# Patient Record
Sex: Female | Born: 1970 | Race: White | Hispanic: No | Marital: Married | Smoking: Never smoker
Health system: Southern US, Community
[De-identification: ages and names within clinical notes are randomized; demographics above are authoritative.]

## PROBLEM LIST (undated history)

## (undated) DIAGNOSIS — F4310 Post-traumatic stress disorder, unspecified: Secondary | ICD-10-CM

## (undated) DIAGNOSIS — F32A Depression, unspecified: Secondary | ICD-10-CM

## (undated) DIAGNOSIS — R519 Headache, unspecified: Secondary | ICD-10-CM

## (undated) DIAGNOSIS — N926 Irregular menstruation, unspecified: Secondary | ICD-10-CM

## (undated) DIAGNOSIS — F329 Major depressive disorder, single episode, unspecified: Secondary | ICD-10-CM

## (undated) DIAGNOSIS — R51 Headache: Secondary | ICD-10-CM

## (undated) DIAGNOSIS — T7840XA Allergy, unspecified, initial encounter: Secondary | ICD-10-CM

## (undated) DIAGNOSIS — N920 Excessive and frequent menstruation with regular cycle: Secondary | ICD-10-CM

## (undated) DIAGNOSIS — R102 Pelvic and perineal pain: Secondary | ICD-10-CM

## (undated) HISTORY — PX: WISDOM TOOTH EXTRACTION: SHX21

## (undated) HISTORY — DX: Pelvic and perineal pain: R10.2

## (undated) HISTORY — DX: Irregular menstruation, unspecified: N92.6

## (undated) HISTORY — PX: DILATION AND CURETTAGE OF UTERUS: SHX78

## (undated) HISTORY — DX: Excessive and frequent menstruation with regular cycle: N92.0

## (undated) HISTORY — PX: KNEE SURGERY: SHX244

## (undated) HISTORY — DX: Allergy, unspecified, initial encounter: T78.40XA

## (undated) HISTORY — DX: Post-traumatic stress disorder, unspecified: F43.10

## (undated) HISTORY — DX: Major depressive disorder, single episode, unspecified: F32.9

## (undated) HISTORY — DX: Depression, unspecified: F32.A

---

## 1996-09-05 HISTORY — PX: TUBAL LIGATION: SHX77

## 2007-01-11 ENCOUNTER — Ambulatory Visit: Payer: Self-pay | Admitting: Family Medicine

## 2008-04-28 ENCOUNTER — Ambulatory Visit: Payer: Self-pay | Admitting: Family Medicine

## 2008-06-25 ENCOUNTER — Ambulatory Visit: Payer: Self-pay | Admitting: Family Medicine

## 2009-01-21 ENCOUNTER — Other Ambulatory Visit: Admission: RE | Admit: 2009-01-21 | Discharge: 2009-01-21 | Payer: Self-pay | Admitting: Family Medicine

## 2009-01-21 ENCOUNTER — Ambulatory Visit: Payer: Self-pay | Admitting: Family Medicine

## 2010-01-05 ENCOUNTER — Ambulatory Visit: Payer: Self-pay | Admitting: Physician Assistant

## 2010-09-13 ENCOUNTER — Ambulatory Visit
Admission: RE | Admit: 2010-09-13 | Discharge: 2010-09-13 | Payer: Self-pay | Source: Home / Self Care | Attending: Family Medicine | Admitting: Family Medicine

## 2011-02-21 ENCOUNTER — Encounter: Payer: Self-pay | Admitting: Family Medicine

## 2011-03-14 ENCOUNTER — Ambulatory Visit: Payer: Self-pay | Admitting: Family Medicine

## 2011-04-11 ENCOUNTER — Other Ambulatory Visit: Payer: Self-pay

## 2011-04-11 MED ORDER — BUPROPION HCL ER (SR) 150 MG PO TB12: 150.0000 mg | ORAL_TABLET | Freq: Two times a day (BID) | ORAL | Status: DC

## 2011-04-12 ENCOUNTER — Other Ambulatory Visit: Payer: Self-pay

## 2011-04-12 MED ORDER — BUPROPION HCL ER (SR) 150 MG PO TB12: 150.0000 mg | ORAL_TABLET | Freq: Two times a day (BID) | ORAL | Status: DC

## 2011-05-06 ENCOUNTER — Encounter: Payer: Self-pay | Admitting: Family Medicine

## 2011-05-06 ENCOUNTER — Other Ambulatory Visit: Payer: Self-pay | Admitting: Family Medicine

## 2014-07-16 ENCOUNTER — Telehealth: Payer: Self-pay

## 2014-07-16 DIAGNOSIS — R102 Pelvic and perineal pain: Secondary | ICD-10-CM

## 2014-07-16 DIAGNOSIS — N939 Abnormal uterine and vaginal bleeding, unspecified: Secondary | ICD-10-CM

## 2014-07-16 NOTE — Telephone Encounter (Signed)
[facility]-- patient visits from 2011-2012. Called office to see what referral is for-- spoke to receptionist who stated she spoke to the provider who referred her and he stated that patient had come in to their office needing an appointment for heavy bleeding and pain with periods. They were unable to see patient due to patient's financial limitations. She does not have insurance. Attempted to contact patient regarding symptoms and possible appointment. Home phone listed was answered by a man who stated I had the wrong number. Called mobile phone-- no voicemail set up. [facility] state they have patient's number as [phone]. Called patient who states she had been having very heavy periods, lasting 7-10 days, however, recently she began bleeding about every two weeks and describes having "the worst pain ever" during her last episode of bleeding. Patient states she has been having  bloating, pressure and pelvic/hip pain-- relates it to the pain she experienced in her last month of pregnancy-- and states "I am having pain even when I am not bleeding." Informed patient we would be getting an appointment scheduled for her, however, I will discuss her case with provider incase an U/S is needed prior to appointment. Patient verbalized understanding and gratitude.   Discussed patient with Dr. Erin FullingHarraway-Smith who requests U/S be done prior to appointment. U/S scheduled for 07/22/14 at 1300. Clinic appointment 07/23/14 at 1545. Attempted to contact patient regarding appointments. Phone rang and rang and then heard busy tone. Unable to leave message.

## 2014-07-17 NOTE — Telephone Encounter (Signed)
Called patient at home and mobile number, no answer and unable to leave message on either

## 2014-07-18 NOTE — Telephone Encounter (Signed)
Called patient and she states she is already aware of those appts. Patient had no questions

## 2014-07-22 ENCOUNTER — Other Ambulatory Visit (HOSPITAL_COMMUNITY): Payer: Self-pay

## 2014-07-23 ENCOUNTER — Encounter: Payer: Self-pay | Admitting: Obstetrics & Gynecology

## 2014-07-23 ENCOUNTER — Ambulatory Visit (HOSPITAL_COMMUNITY)
Admission: RE | Admit: 2014-07-23 | Discharge: 2014-07-23 | Disposition: A | Discharge status: Home / Self Care | Payer: Self-pay | Source: Ambulatory Visit | Attending: Obstetrics & Gynecology | Admitting: Obstetrics & Gynecology

## 2014-07-23 ENCOUNTER — Ambulatory Visit (HOSPITAL_COMMUNITY): Payer: Self-pay

## 2014-07-23 ENCOUNTER — Ambulatory Visit (INDEPENDENT_AMBULATORY_CARE_PROVIDER_SITE_OTHER): Payer: Self-pay | Admitting: Obstetrics & Gynecology

## 2014-07-23 VITALS — BP 129/77 | HR 66 | Temp 98.1°F | Resp 20 | Ht 65.5 in | Wt 200.5 lb

## 2014-07-23 DIAGNOSIS — R938 Abnormal findings on diagnostic imaging of other specified body structures: Secondary | ICD-10-CM

## 2014-07-23 DIAGNOSIS — R9389 Abnormal findings on diagnostic imaging of other specified body structures: Secondary | ICD-10-CM | POA: Insufficient documentation

## 2014-07-23 DIAGNOSIS — R102 Pelvic and perineal pain: Secondary | ICD-10-CM

## 2014-07-23 DIAGNOSIS — N939 Abnormal uterine and vaginal bleeding, unspecified: Secondary | ICD-10-CM

## 2014-07-23 DIAGNOSIS — N859 Noninflammatory disorder of uterus, unspecified: Secondary | ICD-10-CM | POA: Insufficient documentation

## 2014-07-23 MED ORDER — NAPROXEN 500 MG PO TABS: 500.0000 mg | ORAL_TABLET | Freq: Two times a day (BID) | ORAL | Status: DC

## 2014-07-23 MED ORDER — MEGESTROL ACETATE 20 MG PO TABS: 40.0000 mg | ORAL_TABLET | Freq: Two times a day (BID) | ORAL | Status: DC

## 2014-07-23 NOTE — Patient Instructions (Signed)
Endometrial Ablation Endometrial ablation removes the lining of the uterus (endometrium). It is usually a same-day, outpatient treatment. Ablation helps avoid major surgery, such as surgery to remove the cervix and uterus (hysterectomy). After endometrial ablation, you will have little or no menstrual bleeding and may not be able to have children. However, if you are premenopausal, you will need to use a reliable method of birth control following the procedure because of the small chance that pregnancy can occur. There are different reasons to have this procedure, which include:  Heavy periods.  Bleeding that is causing anemia.  Irregular bleeding.  Bleeding fibroids on the lining inside the uterus if they are smaller than 3 centimeters. This procedure should not be done if:  You want children in the future.  You have severe cramps with your menstrual period.  You have precancerous or cancerous cells in your uterus.  You were recently pregnant.  You have gone through menopause.  You have had major surgery on the uterus, such as a cesarean delivery. LET YOUR HEALTH [place]isure PROVIDER KNOW ABOUT:  Any allergies you have.  All medicines you are taking, including vitamins, herbs, eye drops, creams, and over-the-counter medicines.  Previous problems you or members of your family have had with the use of anesthetics.  Any blood disorders you have.  Previous surgeries you have had.  Medical conditions you have. RISKS AND COMPLICATIONS  Generally, this is a safe procedure. However, as with any procedure, complications can occur. Possible complications include:  Perforation of the uterus.  Bleeding.  Infection of the uterus, bladder, or vagina.  Injury to surrounding organs.  An air bubble to the lung (air embolus).  Pregnancy following the procedure.  Failure of the procedure to help the problem, requiring hysterectomy.  Decreased ability to diagnose cancer in the lining of  the uterus. BEFORE THE PROCEDURE  The lining of the uterus must be tested to make sure there is no pre-cancerous or cancer cells present.  An ultrasound may be performed to look at the size of the uterus and to check for abnormalities.  Medicines may be given to thin the lining of the uterus. PROCEDURE  During the procedure, your health Ortega provider will use a tool called a resectoscope to help see inside your uterus. There are different ways to remove the lining of your uterus.   Radiofrequency - This method uses a radiofrequency-alternating electric current to remove the lining of the uterus.  Cryotherapy - This method uses extreme cold to freeze the lining of the uterus.  Heated-Free Liquid - This method uses heated salt (saline) solution to remove the lining of the uterus.  Microwave - This method uses high-energy microwaves to heat up the lining of the uterus to remove it.  Thermal balloon - This method involves inserting a catheter with a balloon tip into the uterus. The balloon tip is filled with heated fluid to remove the lining of the uterus. AFTER THE PROCEDURE  After your procedure, do not have sexual intercourse or insert anything into your vagina until permitted by your health [place] provider. After the procedure, you may experience:  Cramps.  Vaginal discharge.  Frequent urination. Document Released: 07/01/2004 Document Revised: 04/24/2013 Document Reviewed: 01/23/2013 ExitCare Patient Information 2015 [employer]. This information is not intended to replace advice given to you by your health Svec provider. Make sure you discuss any questions you have with your health Lolli provider. Hysteroscopy Hysteroscopy is a procedure used for looking inside the womb (uterus). It   may be done for various reasons, including:  To evaluate abnormal bleeding, fibroid (benign, noncancerous) tumors, polyps, scar tissue (adhesions), and possibly cancer of the uterus.  To look for  lumps (tumors) and other uterine growths.  To look for causes of why a woman cannot get pregnant (infertility), causes of recurrent loss of pregnancy (miscarriages), or a lost intrauterine device (IUD).  To perform a sterilization by blocking the fallopian tubes from inside the uterus. In this procedure, a thin, flexible tube with a tiny light and camera on the end of it (hysteroscope) is used to look inside the uterus. A hysteroscopy should be done right after a menstrual period to be sure you are not pregnant. LET YOUR HEALTH Ryce PROVIDER KNOW ABOUT:   Any allergies you have.  All medicines you are taking, including vitamins, herbs, eye drops, creams, and over-the-counter medicines.  Previous problems you or members of your family have had with the use of anesthetics.  Any blood disorders you have.  Previous surgeries you have had.  Medical conditions you have. RISKS AND COMPLICATIONS  Generally, this is a safe procedure. However, as with any procedure, complications can occur. Possible complications include:  Putting a hole in the uterus.  Excessive bleeding.  Infection.  Damage to the cervix.  Injury to other organs.  Allergic reaction to medicines.  Too much fluid used in the uterus for the procedure. BEFORE THE PROCEDURE   Ask your health [place]vlin provider about changing or stopping any regular medicines.  Do not take aspirin or blood thinners for 1 week before the procedure, or as directed by your health Denson provider. These can cause bleeding.  If you smoke, do not smoke for 2 weeks before the procedure.  In some cases, a medicine is placed in the cervix the day before the procedure. This medicine makes the cervix have a larger opening (dilate). This makes it easier for the instrument to be inserted into the uterus during the procedure.  Do not eat or drink anything for at least 8 hours before the surgery.  Arrange for someone to take you home after the  procedure. PROCEDURE   You may be given a medicine to relax you (sedative). You may also be given one of the following:  A medicine that numbs the area around the cervix (local anesthetic).  A medicine that makes you sleep through the procedure (general anesthetic).  The hysteroscope is inserted through the vagina into the uterus. The camera on the hysteroscope sends a picture to a TV screen. This gives the surgeon a good view inside the uterus.  During the procedure, air or a liquid is put into the uterus, which allows the surgeon to see better.  Sometimes, tissue is gently scraped from inside the uterus. These tissue samples are sent to a lab for testing. AFTER THE PROCEDURE   If you had a general anesthetic, you may be groggy for a couple hours after the procedure.  If you had a local anesthetic, you will be able to go home as soon as you are stable and feel ready.  You may have some cramping. This normally lasts for a couple days.  You may have bleeding, which varies from light spotting for a few days to menstrual-like bleeding for 3-7 days. This is normal.  If your test results are not back during the visit, make an appointment with your health [place]ia provider to find out the results. Document Released: 11/28/2000 Document Revised: 06/12/2013 Document Reviewed: 03/21/2013 ExitCare Patient   Information 2015 [employer]. This information is not intended to replace advice given to you by your health [place] provider. Make sure you discuss any questions you have with your health [place] provider.  

## 2014-07-23 NOTE — Progress Notes (Signed)
CLINIC ENCOUNTER NOTE  History:  43 y.o. [phone] here today for management for AUB, [facility] to lack of insurance. She had an ultrasound today. She wants to discuss further management; the irregular bleeding is "driving me crazy". Associated with cramping.  The following portions of the patient's history were reviewed and updated as appropriate: allergies, current medications, past family history, past medical history, past social history, past surgical history and problem list. Normal pap and negative HRHPV on 2014.   Review of Systems:  Pertinent items are noted in HPI.  Objective:  BP 129/77 mmHg  Pulse 66  Temp(Src) 98.1 F (36.7 C) (Oral)  Resp 20  Ht 5' 5.5" (1.664 m)  Wt 200 lb 8 oz (90.946 kg)  BMI 32.85 kg/m2  LMP 07/08/2014 (Exact Date) Physical Exam deferred  Labs and Imaging 07/23/2014   TRANSABDOMINAL AND TRANSVAGINAL ULTRASOUND OF PELVIS CLINICAL DATA:  Abnormal uterine bleeding.   TECHNIQUE: Both transabdominal and transvaginal ultrasound examinations of the pelvis were performed. Transabdominal technique was performed for global imaging of the pelvis including uterus, ovaries, adnexal regions, and pelvic cul-de-sac. It was necessary to proceed with endovaginal exam following the transabdominal exam to visualize the endometrium an uterus.  COMPARISON:  None  FINDINGS: Uterus  Measurements: 8.2 x 5.6 x 6.7 cm. No fibroids or other mass visualized.  Endometrium  Thickness: 12.7 mm. There are several focal echogenic lesions. The largest is identified posteriorly measuring 1.1 x 0.6 x 0.9 cm. Anteriorly there is a focal defect measuring 1 x 0.6 x 0.6 cm. Within the fundus there is a foci measuring 0.7 x 0.6 x 0.7 cm. Profusion in all of these lesions is identified under color Doppler.  Right ovary  Measurements: 2.3 x 0.8 x 1.2 cm. Normal appearance.  No mass.  Left ovary  Measurements: Not visualize. No adnexal mass.  Other findings  Trace free  fluid identified within the pelvis.  IMPRESSION: 1. Several focal endometrial lesions are suspected. Findings may represent endometrial polyps or carcinoma. Consider sonohysterogram for further evaluation, prior to hysteroscopy or endometrial biopsy.   Electronically Signed   By: Signa Kellaylor  Stroud M.D.   On: 07/23/2014 15:08    Assessment & Plan:  Recommended office endometrial biopsy, but patient declines this due to pain.  Discussed management options for abnormal uterine bleeding including oral progesterone, Depo Provera, Mirena IUD, endometrial ablation (Novasure/Hydrothermal Ablation) or hysterectomy as definitive surgical management.  Discussed risks and benefits of each method.  Patient wants to go undergo hysteroscopic hydrothermal endometrial ablation, dilation and curettage.  Discussed that in the absence of preoperative endometrial biopsy; if precancerous/cancerous cells are noted, she will need further definitive surgery.   Risks of surgery discussed in detail. Patient agrees to plan. She was told that she will be contacted by our surgical scheduler regarding the time and date of her surgery; routine preoperative instructions of having nothing to eat or drink after midnight on the day prior to surgery and also coming to the hospital 1.5 hours prior to her time of surgery were also emphasized. She was told she may be called for a preoperative appointment about a week prior to surgery and will be given further preoperative instructions at that visit. Printed patient education handouts about the procedure were given to the patient to review at home. Megace and Naproxen prescribed for now, bleeding and pain precautions reviewed.  Routine preventative health maintenance measures emphasized.   Jaynie CollinsUGONNA  Lanesha Azzaro, MD, FACOG Attending Obstetrician & Gynecologist  [facility]

## 2014-09-17 NOTE — Anesthesia Preprocedure Evaluation (Addendum)
Anesthesia Evaluation  Patient identified by MRN, date of birth, ID band Patient awake    Reviewed: Allergy & Precautions, NPO status , Patient's Chart, lab work & pertinent test results  History of Anesthesia Complications Negative for: history of anesthetic complications  Airway Mallampati: II  TM Distance: >3 FB Neck ROM: Full    Dental no notable dental hx. (+) Dental Advisory Given   Pulmonary neg pulmonary ROS,  breath sounds clear to auscultation  Pulmonary exam normal       Cardiovascular negative cardio ROS  Rhythm:Regular Rate:Normal     Neuro/Psych PSYCHIATRIC DISORDERS Anxiety Depression negative neurological ROS     GI/Hepatic negative GI ROS, Neg liver ROS,   Endo/Other  negative endocrine ROS  Renal/GU negative Renal ROS  Female GU complaint  negative genitourinary   Musculoskeletal negative musculoskeletal ROS (+)   Abdominal Normal abdominal exam  (+)   Peds negative pediatric ROS (+)  Hematology negative hematology ROS (+)   Anesthesia Other Findings   Reproductive/Obstetrics negative OB ROS                            Anesthesia Physical Anesthesia Plan  ASA: II  Anesthesia Plan: General   Post-op Pain Management:    Induction: Intravenous  Airway Management Planned: LMA  Additional Equipment:   Intra-op Plan:   Post-operative Plan: Extubation in OR  Informed Consent: I have reviewed the patients History and Physical, chart, labs and discussed the procedure including the risks, benefits and alternatives for the proposed anesthesia with the patient or authorized representative who has indicated his/her understanding and acceptance.   Dental advisory given  Plan Discussed with: CRNA, Anesthesiologist and Surgeon  Anesthesia Plan Comments:        Anesthesia Quick Evaluation

## 2014-09-18 ENCOUNTER — Ambulatory Visit (HOSPITAL_COMMUNITY): Payer: Self-pay | Admitting: Anesthesiology

## 2014-09-18 ENCOUNTER — Ambulatory Visit (HOSPITAL_COMMUNITY)
Admission: RE | Admit: 2014-09-18 | Discharge: 2014-09-18 | Disposition: A | Discharge status: Home / Self Care | Payer: Self-pay | Source: Ambulatory Visit | Attending: Obstetrics & Gynecology | Admitting: Obstetrics & Gynecology

## 2014-09-18 ENCOUNTER — Encounter (HOSPITAL_COMMUNITY): Payer: Self-pay | Admitting: Anesthesiology

## 2014-09-18 ENCOUNTER — Encounter (HOSPITAL_COMMUNITY)
Admission: RE | Disposition: A | Discharge status: Home / Self Care | Payer: Self-pay | Source: Ambulatory Visit | Attending: Obstetrics & Gynecology

## 2014-09-18 DIAGNOSIS — F329 Major depressive disorder, single episode, unspecified: Secondary | ICD-10-CM | POA: Insufficient documentation

## 2014-09-18 DIAGNOSIS — N939 Abnormal uterine and vaginal bleeding, unspecified: Secondary | ICD-10-CM | POA: Insufficient documentation

## 2014-09-18 DIAGNOSIS — F4310 Post-traumatic stress disorder, unspecified: Secondary | ICD-10-CM | POA: Insufficient documentation

## 2014-09-18 DIAGNOSIS — F419 Anxiety disorder, unspecified: Secondary | ICD-10-CM | POA: Insufficient documentation

## 2014-09-18 DIAGNOSIS — N925 Other specified irregular menstruation: Secondary | ICD-10-CM | POA: Insufficient documentation

## 2014-09-18 HISTORY — PX: DILITATION & CURRETTAGE/HYSTROSCOPY WITH HYDROTHERMAL ABLATION: SHX5570

## 2014-09-18 LAB — CBC
HCT: 38.2 % (ref 36.0–46.0)
HEMOGLOBIN: 12.4 g/dL (ref 12.0–15.0)
MCH: 27.1 pg (ref 26.0–34.0)
MCHC: 32.5 g/dL (ref 30.0–36.0)
MCV: 83.4 fL (ref 78.0–100.0)
Platelets: 293 10*3/uL (ref 150–400)
RBC: 4.58 MIL/uL (ref 3.87–5.11)
RDW: 15.1 % (ref 11.5–15.5)
WBC: 6.5 10*3/uL (ref 4.0–10.5)

## 2014-09-18 LAB — PREGNANCY, URINE: Preg Test, Ur: NEGATIVE

## 2014-09-18 SURGERY — DILATATION & CURETTAGE/HYSTEROSCOPY WITH HYDROTHERMAL ABLATION
Anesthesia: General | Site: Vagina

## 2014-09-18 MED ORDER — KETOROLAC TROMETHAMINE 30 MG/ML IJ SOLN
INTRAMUSCULAR | Status: AC
  Filled 2014-09-18: qty 1

## 2014-09-18 MED ORDER — IBUPROFEN 600 MG PO TABS: 600.0000 mg | ORAL_TABLET | Freq: Four times a day (QID) | ORAL | Status: DC | PRN

## 2014-09-18 MED ORDER — SODIUM CHLORIDE 0.9 % IR SOLN
Status: DC | PRN
  Administered 2014-09-18: 3000 mL

## 2014-09-18 MED ORDER — LACTATED RINGERS IV SOLN
INTRAVENOUS | Status: DC | PRN
  Administered 2014-09-18: 13:00:00 via INTRAVENOUS

## 2014-09-18 MED ORDER — FENTANYL CITRATE 0.05 MG/ML IJ SOLN
25.0000 ug | INTRAMUSCULAR | Status: DC | PRN
  Administered 2014-09-18: 50 ug via INTRAVENOUS
  Administered 2014-09-18: 25 ug via INTRAVENOUS

## 2014-09-18 MED ORDER — DOCUSATE SODIUM 100 MG PO CAPS: 100.0000 mg | ORAL_CAPSULE | Freq: Two times a day (BID) | ORAL | Status: DC | PRN

## 2014-09-18 MED ORDER — ONDANSETRON HCL 4 MG/2ML IJ SOLN
INTRAMUSCULAR | Status: DC | PRN
  Administered 2014-09-18: 4 mg via INTRAVENOUS

## 2014-09-18 MED ORDER — DEXAMETHASONE SODIUM PHOSPHATE 4 MG/ML IJ SOLN
INTRAMUSCULAR | Status: AC
  Filled 2014-09-18: qty 1

## 2014-09-18 MED ORDER — OXYCODONE-ACETAMINOPHEN 5-325 MG PO TABS: 1.0000 | ORAL_TABLET | Freq: Four times a day (QID) | ORAL | Status: AC | PRN

## 2014-09-18 MED ORDER — LIDOCAINE HCL (CARDIAC) 20 MG/ML IV SOLN
INTRAVENOUS | Status: DC | PRN
  Administered 2014-09-18: 70 mg via INTRAVENOUS
  Administered 2014-09-18: 30 mg via INTRAVENOUS

## 2014-09-18 MED ORDER — BUPIVACAINE HCL 0.5 % IJ SOLN
INTRAMUSCULAR | Status: DC | PRN
  Administered 2014-09-18: 30 mL

## 2014-09-18 MED ORDER — ACETAMINOPHEN 160 MG/5ML PO SOLN: 325.0000 mg | ORAL | Status: DC | PRN

## 2014-09-18 MED ORDER — ACETAMINOPHEN 325 MG PO TABS: 325.0000 mg | ORAL_TABLET | ORAL | Status: DC | PRN

## 2014-09-18 MED ORDER — FENTANYL CITRATE 0.05 MG/ML IJ SOLN
INTRAMUSCULAR | Status: AC
  Filled 2014-09-18: qty 2

## 2014-09-18 MED ORDER — SCOPOLAMINE 1 MG/3DAYS TD PT72
MEDICATED_PATCH | TRANSDERMAL | Status: AC
  Administered 2014-09-18: 1.5 mg via TRANSDERMAL
  Filled 2014-09-18: qty 1

## 2014-09-18 MED ORDER — FENTANYL CITRATE 0.05 MG/ML IJ SOLN: 25.0000 ug | INTRAMUSCULAR | Status: DC | PRN

## 2014-09-18 MED ORDER — LACTATED RINGERS IV SOLN
INTRAVENOUS | Status: DC
  Administered 2014-09-18: 13:00:00 via INTRAVENOUS

## 2014-09-18 MED ORDER — BUPIVACAINE HCL (PF) 0.5 % IJ SOLN
INTRAMUSCULAR | Status: AC
  Filled 2014-09-18: qty 30

## 2014-09-18 MED ORDER — LIDOCAINE HCL (CARDIAC) 20 MG/ML IV SOLN
INTRAVENOUS | Status: AC
  Filled 2014-09-18: qty 5

## 2014-09-18 MED ORDER — BUPIVACAINE HCL (PF) 0.25 % IJ SOLN
INTRAMUSCULAR | Status: AC
  Filled 2014-09-18: qty 30

## 2014-09-18 MED ORDER — KETOROLAC TROMETHAMINE 30 MG/ML IJ SOLN
INTRAMUSCULAR | Status: DC | PRN
  Administered 2014-09-18: 30 mg via INTRAVENOUS

## 2014-09-18 MED ORDER — SCOPOLAMINE 1 MG/3DAYS TD PT72
1.0000 | MEDICATED_PATCH | Freq: Once | TRANSDERMAL | Status: DC
  Administered 2014-09-18: 1.5 mg via TRANSDERMAL

## 2014-09-18 MED ORDER — LACTATED RINGERS IV SOLN: INTRAVENOUS | Status: DC

## 2014-09-18 MED ORDER — PROPOFOL 10 MG/ML IV BOLUS
INTRAVENOUS | Status: DC | PRN
  Administered 2014-09-18: 180 mg via INTRAVENOUS
  Administered 2014-09-18: 20 mg via INTRAVENOUS

## 2014-09-18 MED ORDER — MIDAZOLAM HCL 2 MG/2ML IJ SOLN
INTRAMUSCULAR | Status: AC
  Filled 2014-09-18: qty 2

## 2014-09-18 MED ORDER — MEPERIDINE HCL 25 MG/ML IJ SOLN: 6.2500 mg | INTRAMUSCULAR | Status: DC | PRN

## 2014-09-18 MED ORDER — DEXAMETHASONE SODIUM PHOSPHATE 10 MG/ML IJ SOLN
INTRAMUSCULAR | Status: DC | PRN
  Administered 2014-09-18: 4 mg via INTRAVENOUS

## 2014-09-18 MED ORDER — ONDANSETRON HCL 4 MG/2ML IJ SOLN
INTRAMUSCULAR | Status: AC
  Filled 2014-09-18: qty 2

## 2014-09-18 MED ORDER — ONDANSETRON HCL 4 MG/2ML IJ SOLN: 4.0000 mg | Freq: Once | INTRAMUSCULAR | Status: DC | PRN

## 2014-09-18 MED ORDER — PROPOFOL 10 MG/ML IV BOLUS
INTRAVENOUS | Status: AC
  Filled 2014-09-18: qty 20

## 2014-09-18 MED ORDER — MIDAZOLAM HCL 2 MG/2ML IJ SOLN
INTRAMUSCULAR | Status: DC | PRN
  Administered 2014-09-18: 1 mg via INTRAVENOUS

## 2014-09-18 MED ORDER — FENTANYL CITRATE 0.05 MG/ML IJ SOLN
INTRAMUSCULAR | Status: DC | PRN
  Administered 2014-09-18 (×2): 50 ug via INTRAVENOUS

## 2014-09-18 MED ORDER — MEGESTROL ACETATE 20 MG PO TABS
40.0000 mg | ORAL_TABLET | Freq: Two times a day (BID) | ORAL | Status: DC | PRN
Start: 2014-09-18 — End: 2016-10-07

## 2014-09-18 MED ORDER — METOCLOPRAMIDE HCL 5 MG/ML IJ SOLN: 10.0000 mg | Freq: Once | INTRAMUSCULAR | Status: DC | PRN

## 2014-09-18 SURGICAL SUPPLY — 12 items
CATH ROBINSON RED A/P 16FR (CATHETERS) ×3 IMPLANT
CLOTH BEACON ORANGE TIMEOUT ST (SAFETY) ×3 IMPLANT
CONTAINER PREFILL 10% NBF 60ML (FORM) ×6 IMPLANT
GLOVE ECLIPSE 7.0 STRL STRAW (GLOVE) ×3 IMPLANT
GOWN STRL REUS W/TWL LRG LVL3 (GOWN DISPOSABLE) ×6 IMPLANT
NDL SPNL 20GX3.5 QUINCKE YW (NEEDLE) ×1 IMPLANT
NEEDLE SPNL 20GX3.5 QUINCKE YW (NEEDLE) ×3 IMPLANT
PACK VAGINAL MINOR WOMEN LF (CUSTOM PROCEDURE TRAY) ×3 IMPLANT
PAD OB MATERNITY 4.3X12.25 (PERSONAL CARE ITEMS) ×3 IMPLANT
SET GENESYS HTA PROCERVA (MISCELLANEOUS) ×3 IMPLANT
TOWEL OR 17X24 6PK STRL BLUE (TOWEL DISPOSABLE) ×6 IMPLANT
WATER STERILE IRR 1000ML POUR (IV SOLUTION) ×3 IMPLANT

## 2014-09-18 NOTE — Transfer of Care (Signed)
Immediate Anesthesia Transfer of [place] Note  Patient: Ruth Santana  Procedure(s) Performed: Procedure(s): DILATATION & CURETTAGE/HYSTEROSCOPY WITH HYDROTHERMAL ABLATION (N/A)  Patient Location: PACU  Anesthesia Type:General  Level of Consciousness: awake, alert , oriented and patient cooperative  Airway & Oxygen Therapy: Patient Spontanous Breathing and Patient connected to nasal cannula oxygen  Post-op Assessment: Report given to PACU RN and Post -op Vital signs reviewed and stable  Post vital signs: Reviewed and stable  Complications: No apparent anesthesia complications

## 2014-09-18 NOTE — H&P (Signed)
Preoperative History and Physical  [facility] is a 44 y.o. [id number] here for surgical management of AUB.   No significant preoperative concerns.  Proposed surgery: HYSTEROSCOPY, DILATATION & CURETTAGE WITH HYDROTHERMAL ENDOMETRIAL ABLATION  Past Medical History  Diagnosis Date  . Depression   . PTSD (post-traumatic stress disorder)   . Allergy   . Pelvic pain in female   . Irregular periods   . Menorrhagia    Past Surgical History  Procedure Laterality Date  . Knee surgery    . Tubal ligation  1998  . Cesarean section  1992  . Dilation and curettage of uterus    . Wisdom tooth extraction     OB History  Gravida Para Term Preterm AB SAB TAB Ectopic Multiple Living  4 3 3  1 1    3     # Outcome Date GA Lbr Len/2nd Weight Sex Delivery Anes PTL Lv  4 SAB           3 Term           2 Term           1 Term             Patient denies any other pertinent gynecologic issues. Normal pap and negative HRHPV in 2014.   No current facility-administered medications on file prior to encounter.   Current Outpatient Prescriptions on File Prior to Encounter  Medication Sig Dispense Refill  . Garcinia Cambogia-Chromium 500-200 MG-MCG TABS Take 3 tablets by mouth 2 (two) times daily.    Marland Kitchen. loratadine (CLARITIN) 10 MG tablet Take 10 mg by mouth daily.    . megestrol (MEGACE) 20 MG tablet Take 2 tablets (40 mg total) by mouth 2 (two) times daily. 90 tablet 5  . naproxen (NAPROSYN) 500 MG tablet Take 1 tablet (500 mg total) by mouth 2 (two) times daily with a meal. As needed for pain 60 tablet 2  . omega-3 acid ethyl esters (LOVAZA) 1 G capsule Take 1 g by mouth daily.    Marland Kitchen. buPROPion (WELLBUTRIN SR) 150 MG 12 hr tablet Take 1 tablet (150 mg total) by mouth 2 (two) times daily. (Patient not taking: Reported on 09/18/2014) 60 tablet 0   No Known Allergies  Social History:   reports that she has never smoked. She does not have any smokeless tobacco history on file. She reports that she drinks  about 4.8 oz of alcohol per week. She reports that she does not use illicit drugs.  Family History  Problem Relation Age of Onset  . Diabetes Mother   . Hypertension Mother   . Depression Mother   . Anxiety disorder Mother     Review of Systems: Noncontributory  PHYSICAL EXAM: Blood pressure 143/85, pulse 63, temperature 98.2 F (36.8 C), temperature source Oral, resp. rate 16, SpO2 100 %. General appearance - alert, well appearing, and in no distress Chest - clear to auscultation, no wheezes, rales or rhonchi, symmetric air entry Heart - normal rate and regular rhythm Abdomen - soft, nontender, nondistended, no masses or organomegaly Pelvic - examination not indicated Extremities - peripheral pulses normal, no pedal edema, no clubbing or cyanosis  Imaging Studies: 07/23/2014 TRANSABDOMINAL AND TRANSVAGINAL ULTRASOUND OF PELVIS CLINICAL DATA: Abnormal uterine bleeding. TECHNIQUE: Both transabdominal and transvaginal ultrasound examinations of the pelvis were performed. Transabdominal technique was performed for global imaging of the pelvis including uterus, ovaries, adnexal regions, and pelvic cul-de-sac. It was necessary to proceed with endovaginal exam following the transabdominal  exam to visualize the endometrium an uterus. COMPARISON: None FINDINGS: Uterus Measurements: 8.2 x 5.6 x 6.7 cm. No fibroids or other mass visualized. Endometrium Thickness: 12.7 mm. There are several focal echogenic lesions. The largest is identified posteriorly measuring 1.1 x 0.6 x 0.9 cm. Anteriorly there is a focal defect measuring 1 x 0.6 x 0.6 cm. Within the fundus there is a foci measuring 0.7 x 0.6 x 0.7 cm. Profusion in all of these lesions is identified under color Doppler. Right ovary Measurements: 2.3 x 0.8 x 1.2 cm. Normal appearance. No mass. Left ovary Measurements: Not visualize. No adnexal mass. Other findings Trace free fluid identified within the pelvis. IMPRESSION: 1. Several  focal endometrial lesions are suspected. Findings may represent endometrial polyps or carcinoma. Consider sonohysterogram for further evaluation, prior to hysteroscopy or endometrial biopsy. Electronically Signed By: Signa Kell M.D. On: 07/23/2014 15:08    Assessment: Patient Active Problem List   Diagnosis Date Noted  . Abnormal uterine bleeding (AUB) 07/23/2014  . Endometrial thickening/foci on ultra sound 07/23/2014    Plan: Patient will undergo surgical management with HYSTEROSCOPY, DILATATION & CURETTAGE WITH HYDROTHERMAL ENDOMETRIAL ABLATION. The risks of surgery were discussed in detail with the patient including but not limited to:  bleeding; infection which may require antibiotics; injury to uterus leading to risk of injury to surrounding intraperitoneal organs, burn injury to vagina or other organs, need for additional procedures including laparoscopy or laparotomy, and other postoperative/anesthesia complications.  Patient was informed that there is a high likelihood of success of controlling her symptoms; however about 5% of patients may require further intervention.and other postoperative/anesthesia complications.  Likelihood of success in alleviating the patient's condition was discussed.  Routine postoperative instructions will be reviewed with the patient and her family in detail after surgery.  The patient concurred with the proposed plan, giving informed written consent for the surgery.  Patient has been NPO since last night she will remain NPO for procedure.  Anesthesia and OR aware.   To OR when ready.  Jaynie Collins, M.D. 09/18/2014 12:58 PM

## 2014-09-18 NOTE — Anesthesia Postprocedure Evaluation (Signed)
  Anesthesia Post-op Note  Patient: Ruth Santana  Procedure(s) Performed: Procedure(s): DILATATION & CURETTAGE/HYSTEROSCOPY WITH HYDROTHERMAL ABLATION (N/A)  Patient Location: PACU  Anesthesia Type:General  Level of Consciousness: awake, alert  and oriented  Airway and Oxygen Therapy: Patient Spontanous Breathing  Post-op Pain: mild  Post-op Assessment: Post-op Vital signs reviewed, Patient's Cardiovascular Status Stable, Respiratory Function Stable, Patent Airway, No signs of Nausea or vomiting and Pain level controlled  Post-op Vital Signs: Reviewed and stable  Last Vitals:  Filed Vitals:   09/18/14 1451  BP: 155/82  Pulse: 70  Temp: 36.9 C  Resp: 16    Complications: No apparent anesthesia complications

## 2014-09-18 NOTE — Op Note (Signed)
PREOPERATIVE DIAGNOSIS:  Abnormal uterine bleeding POSTOPERATIVE DIAGNOSIS: The same PROCEDURE: Hysteroscopy, Dilation and Curettage,  Hydrothermal Endometrial Ablation SURGEON:  Dr. Jaynie CollinsUgonna Verneal Wiers  INDICATIONS: 44 y.o. [id number] here for scheduled surgery for abnormal uterine bleeding. Risks of surgery were discussed with the patient including but not limited to: bleeding; infection which may require antibiotics; injury to uterus leading to risk of injury to surrounding intraperitoneal organs, burn injury to vagina or other organs, need for additional procedures including laparoscopy or laparotomy, and other postoperative/anesthesia complications.  Patient was informed that there is a high likelihood of success of controlling her symptoms; however about 5% of patients may require further intervention.  Written informed consent was obtained.    FINDINGS:  A 8 week size uterus.  Diffuse proliferative endometrium with polypoid lesions.  Normal ostia bilaterally.  ANESTHESIA:   General, paracervical block. INTRAVENOUS FLUIDS: 800 ml of LR ESTIMATED BLOOD LOSS:  20 ml SPECIMENS: Endometrial curettings sent to pathology COMPLICATIONS:  None immediate.  PROCEDURE DETAILS:  The patient was then taken to the operating room where general anesthesia was administered and was found to be adequate.  After an adequate timeout was performed, she was placed in the dorsal lithotomy position and examined; then prepped and draped in the sterile manner.   Her bladder was catheterized for clear, yellow urine. A speculum was then placed in the patient's vagina and a single tooth tenaculum was applied to the anterior lip of the cervix.   A paracervical block using 30 ml of 0.5% Marcaine was administered.  The cervix was sounded to 8 cm and dilated manually with metal dilators to accommodate the hydrothermal ablation hysteroscopic apparatus.  Once the cervix was dilated, a sharp curettage was then performed to obtain a  moderate amount of endometrial curettings and fragment of polypoid lesions.  The hysteroscope was inserted under direct visualization using normal saline as a suspension medium.  The uterine cavity was carefully examined, both ostia were recognized, and diffusely proliferative endometrium was noted.   The hydrothermal ablation was then carried out as per protocol.   Complete ablation of the endometrium was observed and the hysteroscope was removed under direct visualization.  No complications were observed.  The tenaculum was removed from the anterior lip of the cervix, and the vaginal speculum was removed after noting good hemostasis.  The patient tolerated the procedure well and was taken to the recovery area awake, extubated and in stable condition.  The patient will be discharged to home as per PACU criteria.  Routine postoperative instructions given.  She was prescribed Percocet, Ibuprofen and Colace.  She will follow up in the clinic on 10/09/14 for postoperative evaluation.   Jaynie CollinsUGONNA  Felecia Stanfill, MD, FACOG Attending Obstetrician & Gynecologist Faculty Practice, [facility] [place]

## 2014-09-18 NOTE — Discharge Instructions (Signed)
Hysteroscopy, Garcialopez After °Refer to this sheet in the next few weeks. These instructions provide you with information on caring for yourself after your procedure. Your health M[place] provider may also give you more specific instructions. Your treatment has been planned according to current medical practices, but problems sometimes occur. Call your health [place] provider if you have any problems or questions after your procedure.  °WHAT TO EXPECT AFTER THE PROCEDURE °After your procedure, it is typical to have the following: °· You may have some cramping. This normally lasts for a couple days. °· You may have bleeding. This can vary from light spotting for a few days to menstrual-like bleeding for 3-7 days. °HOME [place] INSTRUCTIONS °· Rest for the first 1-2 days after the procedure. °· Only take over-the-counter or prescription medicines as directed by your health Muhammed provider. Do not take aspirin. It can increase the chances of bleeding. °· Take showers instead of baths for 2 weeks or as directed by your health Wiedel provider. °· Do not drive for 24 hours or as directed. °· Do not drink alcohol while taking pain medicine. °· Do not use tampons, douche, or have sexual intercourse for 2 weeks or until your health Blumstein provider says it is okay. °· Take your temperature twice a day for 4-5 days. Write it down each time. °· Follow your health Qu[place] provider's advice about diet, exercise, and lifting. °· If you develop constipation, you may: °¨ Take a mild laxative if your health Forero provider approves. °¨ Add bran foods to your diet. °¨ Drink enough fluids to keep your urine clear or pale yellow. °· Try to have someone with you or available to you for the first 24-48 hours, especially if you were given a general anesthetic. °· Follow up with your health [place] provider as directed. °SEEK MEDICAL [place] IF: °· You feel dizzy or lightheaded. °· You feel sick to your stomach (nauseous). °· You have abnormal vaginal discharge. °· You  have a rash. °· You have pain that is not controlled with medicine. °SEEK IMMEDIATE MEDICAL Herbst IF: °· You have bleeding that is heavier than a normal menstrual period. °· You have a fever. °· You have increasing cramps or pain, not controlled with medicine. °· You have new belly (abdominal) pain. °· You pass out. °· You have pain in the tops of your shoulders (shoulder strap areas). °· You have shortness of breath. °Document Released: 06/12/2013 Document Reviewed: 06/12/2013 °ExitCare® Patient Information ©2015 ExitCare, LLC. This information is not intended to replace advice given to you by your health Senna provider. Make sure you discuss any questions you have with your health B[place] provider. °Endometrial Ablation °Endometrial ablation removes the lining of the uterus (endometrium). It is usually a same-day, outpatient treatment. Ablation helps avoid major surgery, such as surgery to remove the cervix and uterus (hysterectomy). After endometrial ablation, you will have little or no menstrual bleeding and may not be able to have children. However, if you are premenopausal, you will need to use a reliable method of birth control following the procedure because of the small chance that pregnancy can occur. °There are different reasons to have this procedure, which include: °· Heavy periods. °· Bleeding that is causing anemia. °· Irregular bleeding. °· Bleeding fibroids on the lining inside the uterus if they are smaller than 3 centimeters. °This procedure should not be done if: °· You want children in the future. °· You have severe cramps with your menstrual period. °· You have precancerous   or cancerous cells in your uterus. °· You were recently pregnant. °· You have gone through menopause. °· You have had major surgery on the uterus, such as a cesarean delivery. °LET YOUR HEALTH Sanders PROVIDER KNOW ABOUT: °· Any allergies you have. °· All medicines you are taking, including vitamins, herbs, eye drops, creams, and  over-the-counter medicines. °· Previous problems you or members of your family have had with the use of anesthetics. °· Any blood disorders you have. °· Previous surgeries you have had. °· Medical conditions you have. °RISKS AND COMPLICATIONS  °Generally, this is a safe procedure. However, as with any procedure, complications can occur. Possible complications include: °· Perforation of the uterus. °· Bleeding. °· Infection of the uterus, bladder, or vagina. °· Injury to surrounding organs. °· An air bubble to the lung (air embolus). °· Pregnancy following the procedure. °· Failure of the procedure to help the problem, requiring hysterectomy. °· Decreased ability to diagnose cancer in the lining of the uterus. °BEFORE THE PROCEDURE °· The lining of the uterus must be tested to make sure there is no pre-cancerous or cancer cells present. °· An ultrasound may be performed to look at the size of the uterus and to check for abnormalities. °· Medicines may be given to thin the lining of the uterus. °PROCEDURE  °During the procedure, your health [place] provider will use a tool called a resectoscope to help see inside your uterus. There are different ways to remove the lining of your uterus.  °· Radiofrequency - This method uses a radiofrequency-alternating electric current to remove the lining of the uterus. °· Cryotherapy - This method uses extreme cold to freeze the lining of the uterus. °· Heated-Free Liquid - This method uses heated salt (saline) solution to remove the lining of the uterus. °· Microwave - This method uses high-energy microwaves to heat up the lining of the uterus to remove it. °· Thermal balloon - This method involves inserting a catheter with a balloon tip into the uterus. The balloon tip is filled with heated fluid to remove the lining of the uterus. °AFTER THE PROCEDURE  °After your procedure, do not have sexual intercourse or insert anything into your vagina until permitted by your health Lehn  provider. After the procedure, you may experience: °· Cramps. °· Vaginal discharge. °· Frequent urination. °Document Released: 07/01/2004 Document Revised: 04/24/2013 Document Reviewed: 01/23/2013 °ExitCare® Patient Information ©2015 ExitCare, LLC. This information is not intended to replace advice given to you by your health Wert provider. Make sure you discuss any questions you have with your health [place]le[place] provider. ° °Post Anesthesia Home [place] Instructions ° °Activity: °Get plenty of rest for the remainder of the day. A responsible adult should stay with you for 24 hours following the procedure.  °For the next 24 hours, DO NOT: °-Drive a car °-Operate machinery °-Drink alcoholic beverages °-Take any medication unless instructed by your physician °-Make any legal decisions or sign important papers. ° °Meals: °Start with liquid foods such as gelatin or soup. Progress to regular foods as tolerated. Avoid greasy, spicy, heavy foods. If nausea and/or vomiting occur, drink only clear liquids until the nausea and/or vomiting subsides. Call your physician if vomiting continues. ° °Special Instructions/Symptoms: °Your throat may feel dry or sore from the anesthesia or the breathing tube placed in your throat during surgery. If this causes discomfort, gargle with warm salt water. The discomfort should disappear within 24 hours. ° °

## 2014-09-18 NOTE — Anesthesia Procedure Notes (Signed)
Procedure Name: LMA Insertion Date/Time: 09/18/2014 1:56 PM Performed by: Suella GroveMOORE, Jalissa Heinzelman C Pre-anesthesia Checklist: Patient identified, Patient being monitored, Timeout performed, Emergency Drugs available and Suction available Patient Re-evaluated:Patient Re-evaluated prior to inductionOxygen Delivery Method: Circle system utilized and Simple face mask Preoxygenation: Pre-oxygenation with 100% oxygen Intubation Type: IV induction Ventilation: Mask ventilation without difficulty LMA: LMA inserted LMA Size: 4.0 Grade View: Grade II Number of attempts: 1 Placement Confirmation: breath sounds checked- equal and bilateral and positive ETCO2

## 2014-09-19 ENCOUNTER — Encounter (HOSPITAL_COMMUNITY): Payer: Self-pay | Admitting: Obstetrics & Gynecology

## 2014-09-22 ENCOUNTER — Telehealth: Payer: Self-pay | Admitting: *Deleted

## 2014-09-22 NOTE — Telephone Encounter (Signed)
Contacted patient and informed of results. Pt verbalizes understanding and has no further questions.

## 2014-09-22 NOTE — Telephone Encounter (Signed)
-----   Message from Tereso NewcomerUgonna A Anyanwu, MD sent at 09/22/2014  8:02 AM EST ----- Benign endometrial surgical pathology. Attempted to call patient with results but it went to a message saying voicemail has not been set up yet. Please call to inform patient of results.

## 2014-10-09 ENCOUNTER — Ambulatory Visit: Payer: Self-pay | Admitting: Obstetrics & Gynecology

## 2014-10-14 ENCOUNTER — Telehealth: Payer: Self-pay | Admitting: *Deleted

## 2014-10-14 NOTE — Telephone Encounter (Addendum)
Pr left message on 2/5 @ 1144 requesting a call back from a nurse. I returned her call today and we discussed her concern. She stated that she has been having bleeding off and on since her surgery. At times the bleeding was heavy but now has almost stopped completely. She also stated that her pain increased as the days went on after surgery. She is taking ibuprofen daily and Percocet at night only. Her pain is not severe at this time. I advised pt that her symptoms are within the realm of normal however everyone does not have the same exact experience. We would be concerned if her bleeding were to become heavy again at this time and if that happens she should contact us or go to [facility]. It is fine to continue ibuprofen for the cramping. She should discuss her post surgical course in detail with the doctor at her follow up visit next week. She may wish to write everything down in order to help her remember the details.  Pt voiced understanding.

## 2014-10-22 ENCOUNTER — Encounter: Payer: Self-pay | Admitting: Obstetrics & Gynecology

## 2014-10-22 ENCOUNTER — Ambulatory Visit (INDEPENDENT_AMBULATORY_CARE_PROVIDER_SITE_OTHER): Payer: Self-pay | Admitting: Obstetrics & Gynecology

## 2014-10-22 VITALS — BP 132/76 | HR 70 | Temp 97.7°F | Resp 20 | Ht 65.0 in | Wt 207.6 lb

## 2014-10-22 DIAGNOSIS — F32A Depression, unspecified: Secondary | ICD-10-CM

## 2014-10-22 DIAGNOSIS — Z9889 Other specified postprocedural states: Secondary | ICD-10-CM

## 2014-10-22 DIAGNOSIS — F329 Major depressive disorder, single episode, unspecified: Secondary | ICD-10-CM

## 2014-10-22 MED ORDER — BUPROPION HCL ER (SR) 150 MG PO TB12: 150.0000 mg | ORAL_TABLET | Freq: Two times a day (BID) | ORAL | Status: DC

## 2014-10-22 NOTE — Progress Notes (Signed)
   CLINIC ENCOUNTER NOTE  History:  44 y.o. [id number] here today for postoperative check after Hysteroscopy, Dilation and Curettage, Hydrothermal Endometrial Ablation on 09/18/14.  Patient reports having an episode of small amount of vaginal bleeding which started two weeks after surgery and lasted for five days. She has started bleeding again yesterday, just uses pantyliners.  No other symptoms.  Patient has a history of depression and desires refill of her Wellbutrin SR. Has not seen her mental health provider in many months due to moving.   The following portions of the patient's history were reviewed and updated as appropriate: allergies, current medications, past family history, past medical history, past social history, past surgical history and problem list. Normal pap and negative HRHPV in 2014.   Review of Systems:  Pertinent items are noted in HPI.  Objective:  Physical Exam BP 132/76 mmHg  Pulse 70  Temp(Src) 97.7 F (36.5 C) (Oral)  Resp 20  Ht 5\' 5"  (1.651 m)  Wt 207 lb 9.6 oz (94.167 kg)  BMI 34.55 kg/m2  LMP  (LMP Unknown) Gen: NAD Abd: Soft, nontender and nondistended Pelvic: Deferred as per patient  Surgical pathology (09/18/14): Endometrium, curettage - BENIGN ENDOMETRIAL POLYP AND ADJACENT BENIGN WEAKLY PROLIFERATIVE ENDOMETRIUM - NO ATYPIA, HYPERPLASIA OR MALIGNANCY.    Assessment & Plan:  Patient reassured this was normal amount of spotting/bleeding expected after HTA; will observe in next 2-3 months to see what her bleeding pattern will be. Patient will call to schedule mammogram soon; missed last appointment Wellbutrin prescribed, will follow up with mental health provider.  Depression precautions reviewed. Routine preventative health maintenance measures emphasized.   Jaynie CollinsUGONNA  Ulysees Robarts, MD, FACOG Attending Obstetrician & Gynecologist Center for [facility], [facility]

## 2014-10-22 NOTE — Progress Notes (Signed)
[facility]   DOB: November 09, 1970, 44 y.o.   MRN: [id number] Pt reports having an episode of vaginal bleeding which started 2 weeks after surgery and lasted for 5 days. She has started bleeding again yesterday.

## 2014-10-22 NOTE — Patient Instructions (Signed)
Return to clinic for any scheduled appointments or for any gynecologic concerns as needed.   

## 2015-09-24 ENCOUNTER — Ambulatory Visit (INDEPENDENT_AMBULATORY_CARE_PROVIDER_SITE_OTHER): Payer: Self-pay | Admitting: Obstetrics & Gynecology

## 2015-09-24 ENCOUNTER — Encounter: Payer: Self-pay | Admitting: Obstetrics & Gynecology

## 2015-09-24 VITALS — BP 132/76 | HR 68 | Temp 98.2°F | Ht 64.0 in | Wt 183.2 lb

## 2015-09-24 DIAGNOSIS — F32A Depression, unspecified: Secondary | ICD-10-CM

## 2015-09-24 DIAGNOSIS — R102 Pelvic and perineal pain: Secondary | ICD-10-CM | POA: Insufficient documentation

## 2015-09-24 DIAGNOSIS — Z9889 Other specified postprocedural states: Secondary | ICD-10-CM | POA: Insufficient documentation

## 2015-09-24 DIAGNOSIS — F329 Major depressive disorder, single episode, unspecified: Secondary | ICD-10-CM

## 2015-09-24 MED ORDER — BUPROPION HCL ER (SR) 150 MG PO TB12: 150.0000 mg | ORAL_TABLET | Freq: Two times a day (BID) | ORAL | Status: DC

## 2015-09-24 NOTE — Progress Notes (Signed)
CLINIC ENCOUNTER NOTE  History:  45 y.o. [id number] here today reporting increased constant lower pelvic pain after HTA on 09/18/14. Pain is debilitating; takes naproxen which helps sometimes. Does not take narcotics; has IBS and this causes constipation.  She is frustrated; considering hysterectomy.  Wants refill for Wellbutrin.  She denies any abnormal vaginal discharge, bleeding or other concerns.   Past Medical History  Diagnosis Date  . Depression   . PTSD (post-traumatic stress disorder)   . Allergy   . Pelvic pain in female   . Irregular periods   . Menorrhagia     Past Surgical History  Procedure Laterality Date  . Knee surgery    . Tubal ligation  1998  . Dilation and curettage of uterus    . Wisdom tooth extraction    . Dilitation & currettage/hystroscopy with hydrothermal ablation N/A 09/18/2014    Procedure: DILATATION & CURETTAGE/HYSTEROSCOPY WITH HYDROTHERMAL ABLATION;  Surgeon: Tereso Newcomer, MD;  Location: [facility];  Service: Gynecology;  Laterality: N/A;  . Cesarean section  1992    The following portions of the patient's history were reviewed and updated as appropriate: allergies, current medications, past family history, past medical history, past social history, past surgical history and problem list.   Health Maintenance:  Normal pap and negative HRHPV in 2014 as per patient.   Review of Systems:  Pertinent items noted in HPI and remainder of comprehensive ROS otherwise negative.  Objective:  Physical Exam BP 132/76 mmHg  Pulse 68  Temp(Src) 98.2 F (36.8 C) (Oral)  Ht  (1.626 m)  Wt 183 lb 3.2 oz (83.099 kg)  BMI 31.43 kg/m2  LMP 09/18/2015 CONSTITUTIONAL: Well-developed, well-nourished female in no acute distress.  HENT:  Normocephalic, atraumatic. External right and left ear normal. Oropharynx is clear and moist EYES: Conjunctivae and EOM are normal. Pupils are equal, round, and reactive to light. No scleral icterus.  NECK: Normal range of  motion, supple, no masses SKIN: Skin is warm and dry. No rash noted. Not diaphoretic. No erythema. No pallor. NEUROLGIC: Alert and oriented to person, place, and time. Normal reflexes, muscle tone coordination. No cranial nerve deficit noted. PSYCHIATRIC: Normal mood and affect. Normal behavior. Normal judgment and thought content. CARDIOVASCULAR: Normal heart rate noted RESPIRATORY: Effort and breath sounds normal, no problems with respiration noted ABDOMEN: Soft, no distention noted. Mild lower pelvic tenderness to palpation   PELVIC: Normal appearing external genitalia; normal appearing vaginal mucosa and cervix.  No abnormal discharge noted.  Normal uterine size, no other palpable masses, mild uterine or adnexal tenderness. MUSCULOSKELETAL: Normal range of motion. No edema noted.   Assessment & Plan:  1. Pelvic pain in female 2. Status post HTA endometrial ablation 09/18/14 Will get ultrasound for further evaluation - US Transvaginal Non-OB; Future - US Pelvis Complete; Future Continue pain medications prn. If surgery is desired; will recommend LAVH, BS (had one cesarean section followed by 2 SVDs).  Will discuss in detail at next visit.  3. Depression Symptoms treated by Wellbutrin; desires refill - buPROPion (WELLBUTRIN SR) 150 MG 12 hr tablet; Take 1 tablet (150 mg total) by mouth 2 (two) times daily.  Dispense: 60 tablet; Refill: 10  Routine preventative health maintenance measures emphasized. Please refer to After Visit Summary for other counseling recommendations.   Return in about 3 weeks (around 10/15/2015) for Follow up ultrasound results,  ?surgical consult.   Total face-to-face time with patient: 15 minutes. Over 50% of encounter was spent on counseling and  coordination of [place].   Jaynie Collins, MD, FACOG Attending Obstetrician & Gynecologist, [facility] and Center for [facility]

## 2015-09-24 NOTE — Progress Notes (Signed)
Pt given Free Pap Screening info.  [facility]

## 2015-09-24 NOTE — Patient Instructions (Addendum)
Return to clinic for any scheduled appointments or for any gynecologic concerns as needed.   Laparoscopically Assisted Vaginal Hysterectomy A laparoscopically assisted vaginal hysterectomy (LAVH) is a surgical procedure to remove the uterus and cervix, and sometimes the ovaries and fallopian tubes. During an LAVH, some of the surgical removal is done through the vagina, and the rest is done through a few small surgical cuts (incisions) in the abdomen.  This procedure is usually considered in women when a vaginal hysterectomy is not an option. Your health [place] provider will discuss the risks and benefits of the different surgical techniques at your appointment. Generally, recovery time is faster and there are fewer complications after laparoscopic procedures than after open incisional procedures. LET [facility] [place] PROVIDER KNOW ABOUT:   Any allergies you have.  All medicines you are taking, including vitamins, herbs, eye drops, creams, and over-the-counter medicines.  Previous problems you or members of your family have had with the use of anesthetics.  Any blood disorders you have.  Previous surgeries you have had.  Medical conditions you have. RISKS AND COMPLICATIONS Generally, this is a safe procedure. However, as with any procedure, complications can occur. Possible complications include:  Allergies to medicines.  Difficulty breathing.  Bleeding.  Infection.  Damage to other structures near your uterus and cervix. BEFORE THE PROCEDURE  Ask your health [place] provider about changing or stopping your regular medicines.  Take certain medicines, such as a colon-emptying preparation, as directed.  Do not eat or drink anything for at least 8 hours before your surgery.  Stop smoking if you smoke. Stopping will improve your health after surgery.  Arrange for a ride home after surgery and for help at home during recovery. PROCEDURE   An IV tube will be put into one of your  veins in order to give you fluids and medicines.  You will receive medicines to relax you and medicines that make you sleep (general anesthetic).  You may have a flexible tube (catheter) put into your bladder to drain urine.  You may have a tube put through your nose or mouth that goes into your stomach (nasogastric tube). The nasogastric tube removes digestive fluids and prevents you from feeling nauseated and from vomiting.  Tight-fitting (compression) stockings will be placed on your legs to promote circulation.  Three to four small incisions will be made in your abdomen. An incision also will be made in your vagina. Probes and tools will be inserted into the small incisions. The uterus and cervix are removed (and possibly your ovaries and fallopian tubes) through your vagina as well as through the small incisions that were made in the abdomen.  Your vagina is then sewn back to normal. AFTER THE PROCEDURE  You may have a liquid diet temporarily. You will most likely return to, and tolerate, your usual diet the day after surgery.  You will be passing urine through a catheter. It will be removed the day after surgery.  Your temperature, breathing rate, heart rate, blood pressure, and oxygen level will be monitored regularly.  You will still wear compression stockings on your legs until you are able to move around.  You will use a special device or do breathing exercises to keep your lungs clear.  You will be encouraged to walk as soon as possible.   This information is not intended to replace advice given to you by your health Mckel[place] provider. Make sure you discuss any questions you have with your health [place] provider.  Document Released: 08/11/2011 Document Revised: 09/12/2014 Document Reviewed: 03/07/2013 Elsevier Interactive Patient Education [employer]2016 Elsevier Inc.

## 2015-10-06 ENCOUNTER — Ambulatory Visit (HOSPITAL_COMMUNITY)
Admission: RE | Admit: 2015-10-06 | Discharge: 2015-10-06 | Disposition: A | Discharge status: Home / Self Care | Payer: Self-pay | Source: Ambulatory Visit | Attending: Obstetrics & Gynecology | Admitting: Obstetrics & Gynecology

## 2015-10-06 DIAGNOSIS — Z9889 Other specified postprocedural states: Secondary | ICD-10-CM

## 2015-10-06 DIAGNOSIS — N83202 Unspecified ovarian cyst, left side: Secondary | ICD-10-CM | POA: Insufficient documentation

## 2015-10-06 DIAGNOSIS — R102 Pelvic and perineal pain: Secondary | ICD-10-CM | POA: Insufficient documentation

## 2015-10-07 ENCOUNTER — Telehealth: Payer: Self-pay

## 2015-10-07 NOTE — Telephone Encounter (Signed)
Per Dr.Ugonna patient had an U/S perform it only showed small uterus and no new findings. I attempted to call patient but number is disconnected at this time.

## 2015-10-13 ENCOUNTER — Telehealth: Payer: Self-pay | Admitting: General Practice

## 2015-10-13 ENCOUNTER — Encounter: Payer: Self-pay | Admitting: *Deleted

## 2015-10-13 NOTE — Telephone Encounter (Signed)
Patient called and left message stating she is wanting someone to review her ultrasound results with her and go ahead and schedule her surgery rather than waiting for results on 2/13. Called patient and informed her of normal ultrasound results. Patient asked what could be causing her pain. Told patient I cannot answer that question as her pelvis does contain other organs such as her rectum, colon and large intestine. Patient verbalized understanding and asked if there was a need to keep her appointment. Told patient that is up to her, if there is something she wants to talk to the doctor about. Patient verbalized understanding & states she will just keep the appointment for now. Patient had no other questions

## 2015-10-13 NOTE — Telephone Encounter (Signed)
Called pt again to inform of [place] results, however telephone number is still disconnected. Certified letter sent.

## 2015-10-19 ENCOUNTER — Ambulatory Visit: Payer: Self-pay | Admitting: Obstetrics & Gynecology

## 2016-04-14 IMAGING — US US PELVIS COMPLETE
1 series · 13 of 25 positions shown · non-contrast
Comparison: None

CLINICAL DATA: Abnormal uterine bleeding.

EXAM:
TRANSABDOMINAL AND TRANSVAGINAL ULTRASOUND OF PELVIS
TECHNIQUE: Both transabdominal and transvaginal ultrasound examinations of the
pelvis were performed. Transabdominal technique was performed for
global imaging of the pelvis including uterus, ovaries, adnexal
regions, and pelvic cul-de-sac. It was necessary to proceed with
endovaginal exam following the transabdominal exam to visualize the
endometrium an uterus.

[Series 1: us pelvis complete · 59 acquisitions, 13 frames shown]
[im 1/59]
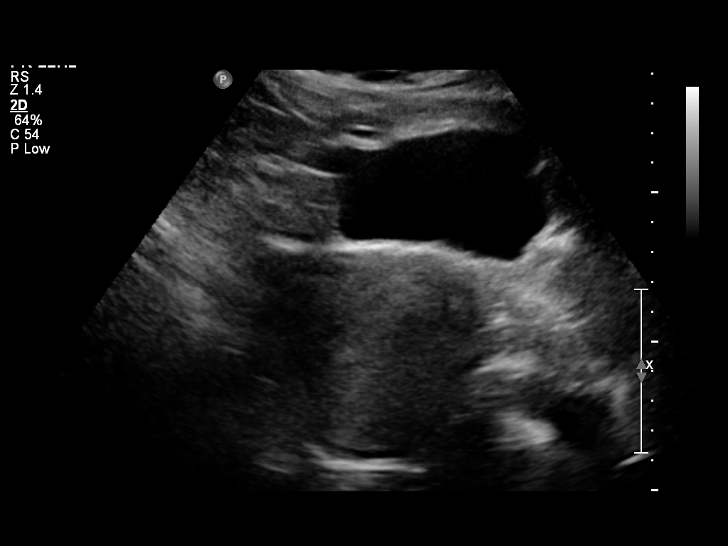
[im 5/59]
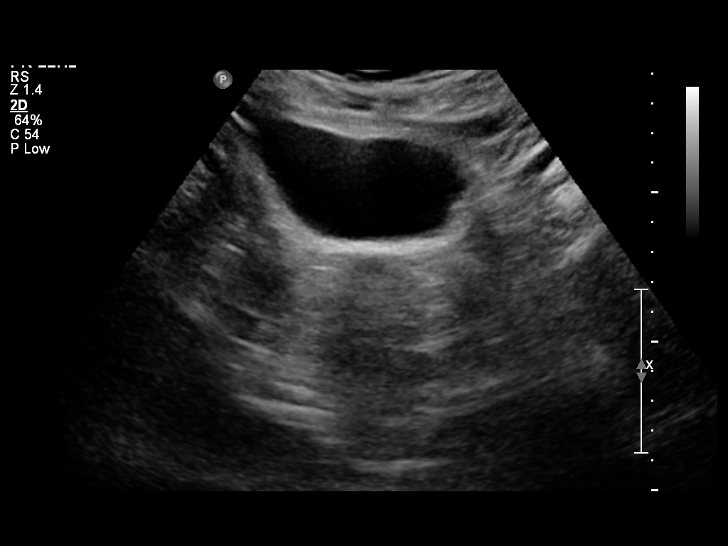
[im 10/59]
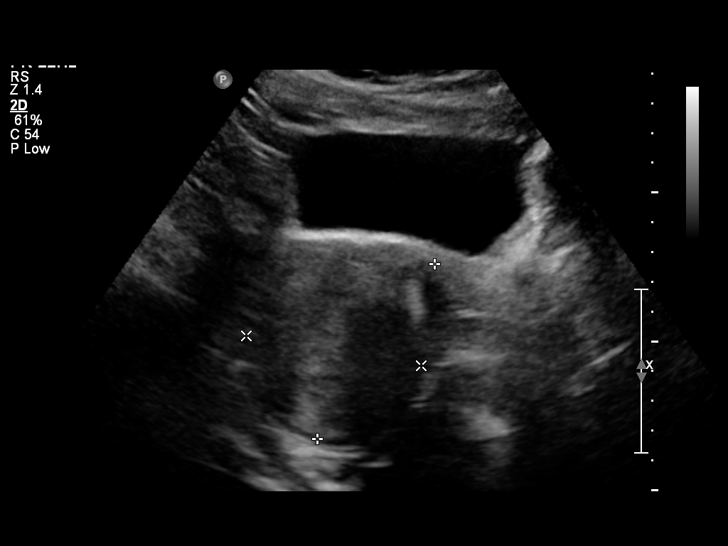
[im 15/59]
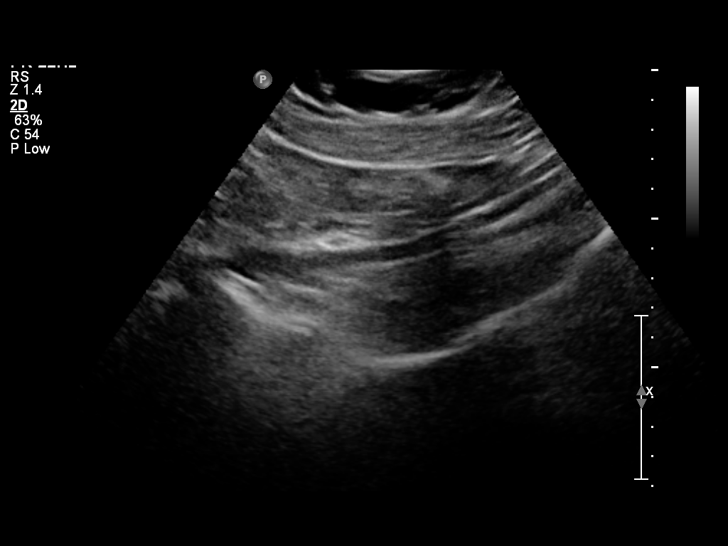
[im 20/59]
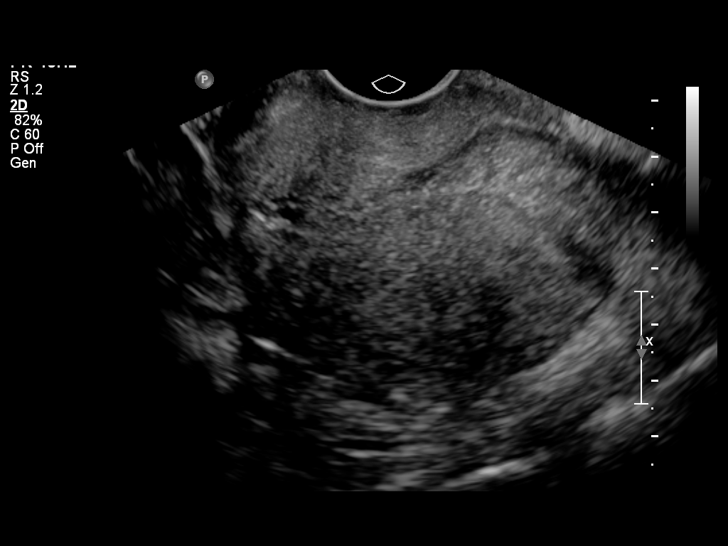
[im 25/59]
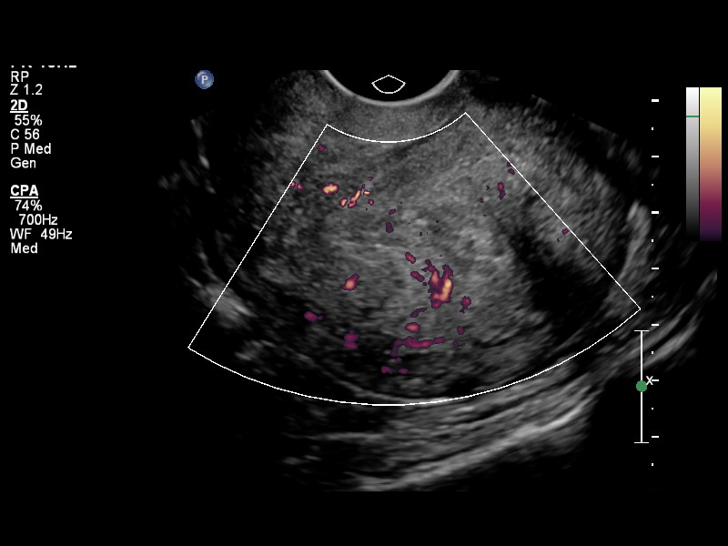
[im 30/59]
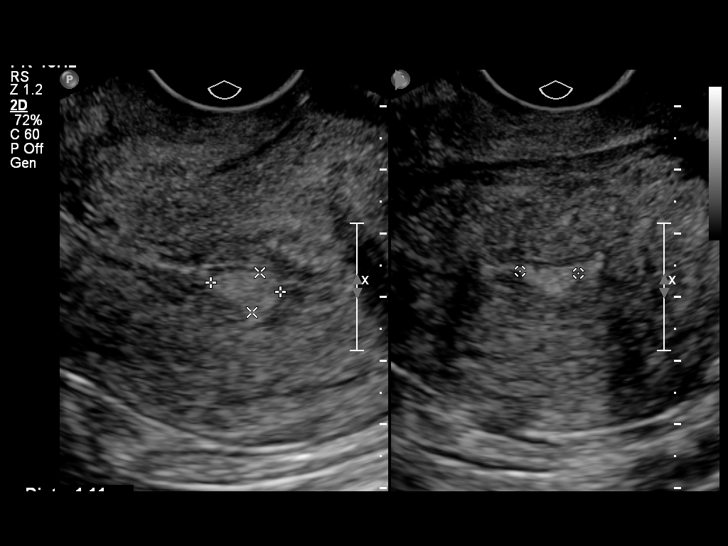
[im 34/59]
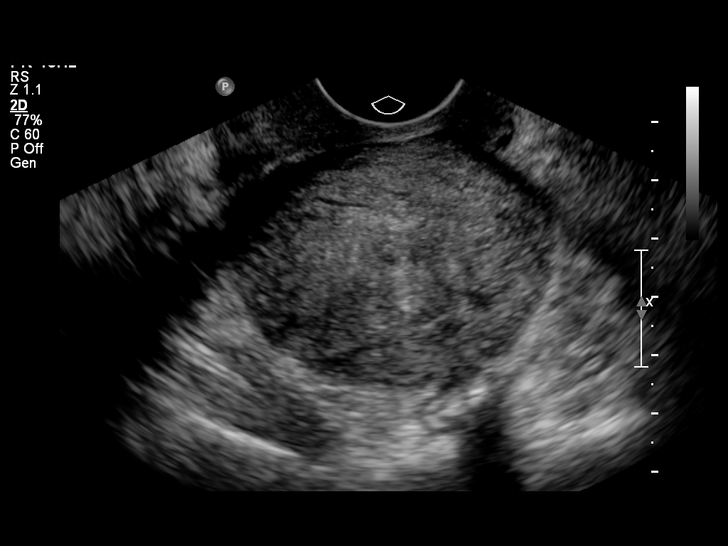
[im 39/59]
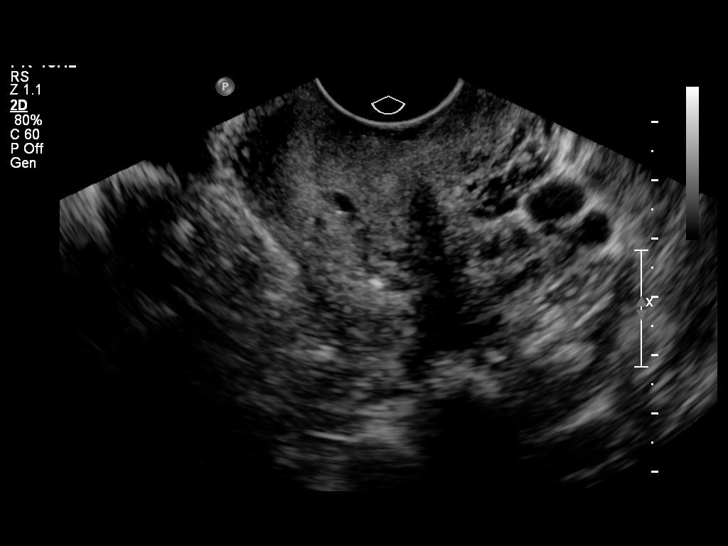
[im 44/59]
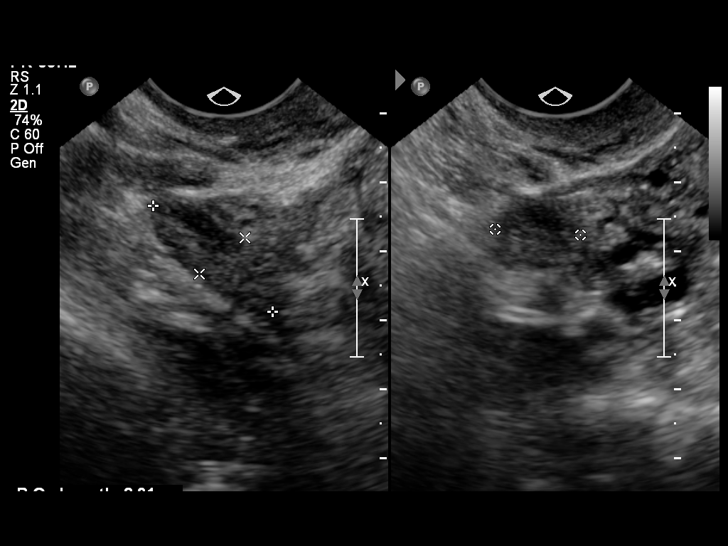
[im 49/59]
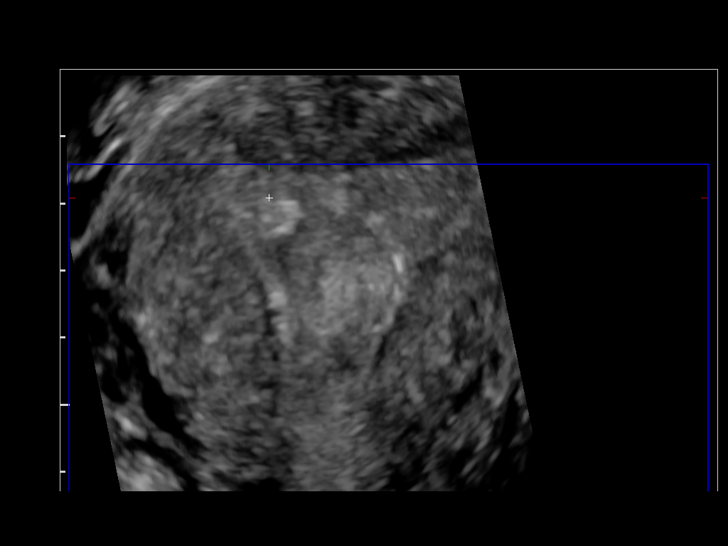
[im 54/59]
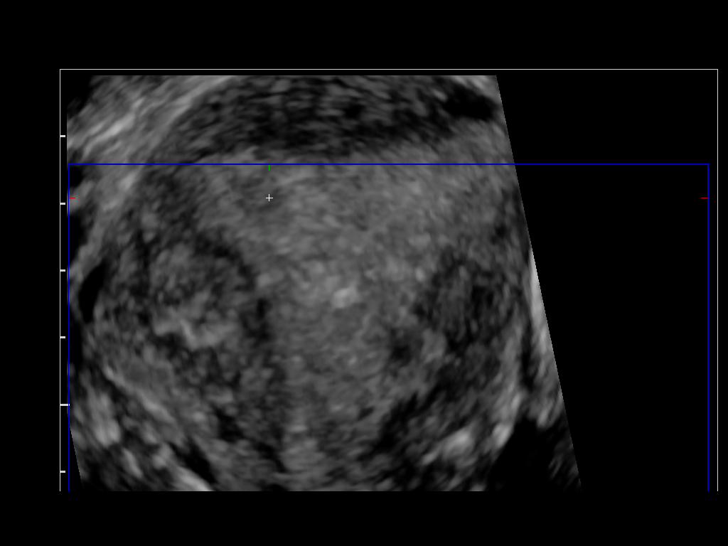
[im 59/59]
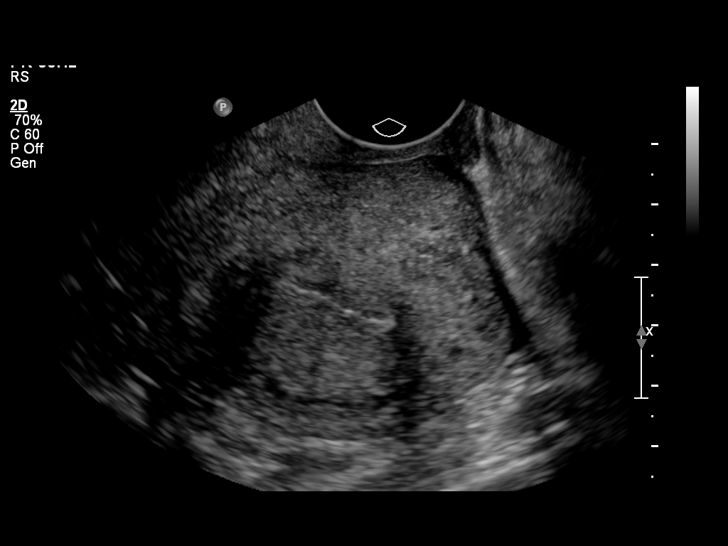

[13 of 25 positions shown; findings below may reference images not displayed]

FINDINGS: Uterus

Measurements: 8.2 x 5.6 x 6.7 cm.. No fibroids or other mass
visualized.

Endometrium

Thickness: 12.7 mm.. There are several focal echogenic lesions. The
largest is identified posteriorly measuring 1.1 x 0.6 x 0.9 cm.
Anteriorly there is a focal defect measuring 1 x 0.6 x 0.6 cm.
Within the fundus there is a foci measuring 0.7 x 0.6 x 0.7 cm.
Profusion in all of these lesions is identified under color Doppler.

Right ovary

Measurements: 2.3 x 0.8 x 1.2 cm.. Normal appearance.  No mass.

Left ovary

Measurements: Not visualize. No adnexal mass.

Other findings

Trace free fluid identified within the pelvis.
IMPRESSION: 1. Several focal endometrial lesions are suspected. Findings may
represent endometrial polyps or carcinoma. Consider sonohysterogram
for further evaluation, prior to hysteroscopy or endometrial biopsy.

## 2016-08-22 ENCOUNTER — Other Ambulatory Visit: Payer: Self-pay | Admitting: Obstetrics & Gynecology

## 2016-08-22 DIAGNOSIS — F329 Major depressive disorder, single episode, unspecified: Secondary | ICD-10-CM

## 2016-08-22 DIAGNOSIS — F32A Depression, unspecified: Secondary | ICD-10-CM

## 2016-10-07 ENCOUNTER — Ambulatory Visit (INDEPENDENT_AMBULATORY_CARE_PROVIDER_SITE_OTHER): Payer: BLUE CROSS/BLUE SHIELD | Admitting: Obstetrics & Gynecology

## 2016-10-07 VITALS — BP 151/81 | HR 59 | Wt 186.7 lb

## 2016-10-07 DIAGNOSIS — N939 Abnormal uterine and vaginal bleeding, unspecified: Secondary | ICD-10-CM

## 2016-10-07 DIAGNOSIS — R102 Pelvic and perineal pain: Secondary | ICD-10-CM

## 2016-10-07 DIAGNOSIS — Z9889 Other specified postprocedural states: Secondary | ICD-10-CM

## 2016-10-07 MED ORDER — IBUPROFEN 800 MG PO TABS: 800.0000 mg | ORAL_TABLET | Freq: Three times a day (TID) | ORAL | 3 refills | Status: DC | PRN

## 2016-10-07 MED ORDER — MEGESTROL ACETATE 40 MG PO TABS: 40.0000 mg | ORAL_TABLET | Freq: Every day | ORAL | 2 refills | Status: DC

## 2016-10-07 MED ORDER — TRAMADOL HCL 50 MG PO TABS: 50.0000 mg | ORAL_TABLET | Freq: Four times a day (QID) | ORAL | 1 refills | Status: DC | PRN

## 2016-10-07 NOTE — Progress Notes (Addendum)
GYNECOLOGY OFFICE VISIT NOTE  History:  46 y.o. [id number] here today for discussion about management of chronic pelvic pain. Also has recurrence of AUB s/p endometrial ablation on 09/18/14.  Bleeding is sporadic, heavy at times, not to level it was prior to HTA.  Pain is more constant, in lower abdomen, seems to be related in intensity to the bleeding.  Ibuprofen does not help with the pain.  She is tired of all the pain and bleeding and desires surgical management. She denies any current abnormal vaginal discharge, bleeding, pelvic pain or other concerns.   Past Medical History:  Diagnosis Date  . Allergy   . Depression   . Irregular periods   . Menorrhagia   . Pelvic pain in female   . PTSD (post-traumatic stress disorder)     Past Surgical History:  Procedure Laterality Date  . CESAREAN SECTION  1992  . DILATION AND CURETTAGE OF UTERUS    . DILITATION & CURRETTAGE/HYSTROSCOPY WITH HYDROTHERMAL ABLATION N/A 09/18/2014   Procedure: DILATATION & CURETTAGE/HYSTEROSCOPY WITH HYDROTHERMAL ABLATION;  Surgeon: Tereso Newcomer, MD;  Location: [facility];  Service: Gynecology;  Laterality: N/A;  . KNEE SURGERY    . TUBAL LIGATION  1998  . WISDOM TOOTH EXTRACTION      The following portions of the patient's history were reviewed and updated as appropriate: allergies, current medications, past family history, past medical history, past social history, past surgical history and problem list.   Health Maintenance:  Normal pap and negative HRHPV in 2014.    Review of Systems:  Pertinent items noted in HPI and remainder of comprehensive ROS otherwise negative.   Objective:  Physical Exam BP (!) 151/81   Pulse (!) 59   Wt 186 lb 11.2 oz (84.7 kg)   BMI 32.05 kg/m  CONSTITUTIONAL: Well-developed, well-nourished female in no acute distress.  HENT:  Normocephalic, atraumatic. External right and left ear normal. Oropharynx is clear and moist EYES: Conjunctivae and EOM are normal. Pupils are  equal, round, and reactive to light. No scleral icterus.  NECK: Normal range of motion, supple, no masses SKIN: Skin is warm and dry. No rash noted. Not diaphoretic. No erythema. No pallor. NEUROLOGIC: Alert and oriented to person, place, and time. Normal reflexes, muscle tone coordination. No cranial nerve deficit noted. PSYCHIATRIC: Normal mood and affect. Normal behavior. Normal judgment and thought content. CARDIOVASCULAR: Normal heart rate noted RESPIRATORY: Effort and breath sounds normal, no problems with respiration noted ABDOMEN: Soft, no distention noted, nontender.   PELVIC: Deferred MUSCULOSKELETAL: Normal range of motion. No edema noted.  Labs and Imaging No results found.  Assessment & Plan:  1. Pelvic pain in female 2. Status post HTA endometrial ablation 09/18/14 3. Abnormal uterine bleeding (AUB) Patient does not want medical or conservative management; desires definitive management with hysterectomy.  I proposed doing a laparoscopic-assisted vaginal hysterectomy (LAVH) and prophylactic bilateral salpingectomy.  No indication for oophorectomy at this point.  Patient agrees with this proposed surgery.  The risks of surgery were discussed in detail with the patient including but not limited to: bleeding which may require transfusion or reoperation; infection which may require antibiotics; injury to bowel, bladder, ureters or other surrounding organs; need for additional procedures including laparotomy; thromboembolic phenomenon, incisional problems and other postoperative/anesthesia complications.  Patient was also advised that she will remain in house for 1 night; and expected recovery time after a hysterectomy is 6-8 weeks.  Likelihood of success in alleviating the patient's symptoms was discussed; emphasized that  the bleeding will be alleviated but the pain may not be alleviated given multi-factorial nature of pelvic pain.   She was told that she will be contacted by our surgical  scheduler regarding the time and date of her surgery; routine preoperative instructions of having nothing to eat or drink after midnight on the day prior to surgery and also coming to the hospital 1.5 hours prior to her time of surgery were also emphasized.  She was told she may be called for a preoperative appointment about a week prior to surgery and will be given further preoperative instructions at that visit.  Routine postoperative instructions will be reviewed with the patient and her family in detail after surgery.  In the meantime, she will use Megace for AUB; bleeding precautions were reviewed. Tramadol and Ibuprofen also prescribed for pain; pain precautions also reviewed.   Printed patient education handouts about the procedure was given to the patient to review at home. Please refer to After Visit Summary for other counseling recommendations.   Patient needs to return for endometrial biopsy and pap smear prior to surgery.    Return in about 2 weeks (around 10/21/2016) for Pap smear and endometrial biopsy; preop exam.  Total face-to-face time with patient: 25 minutes. Over 50% of encounter was spent on counseling and coordination of [place].   Jaynie CollinsUGONNA  ANYANWU, MD, FACOG Attending Obstetrician & Gynecologist, [facility], [facility]

## 2016-10-07 NOTE — Patient Instructions (Signed)
Thank you for enrolling in MyChart. Please follow the instructions below to securely access your online medical record. MyChart allows you to send messages to your doctor, view your test results, manage appointments, and more.   How Do I Sign Up? 1. In your Internet browser, go to [employer] and enter [email or web address]. 2. Click on the Sign Up Now link in the Sign In box. You will see the New Member Sign Up page. 3. Enter your MyChart Access Code exactly as it appears below. You will not need to use this code after you've completed the sign-up process. If you do not sign up before the expiration date, you must request a new code.  MyChart Access Code: [email or web address] Expires: 12/06/2016 10:44 AM  4. Enter your Social Security Number ([email or web address]) and Date of Birth (mm/dd/yyyy) as indicated and click Submit. You will be taken to the next sign-up page. 5. Create a MyChart ID. This will be your MyChart login ID and cannot be changed, so think of one that is secure and easy to remember. 6. Create a MyChart password. You can change your password at any time. 7. Enter your Password Reset Question and Answer. This can be used at a later time if you forget your password.  8. Enter your e-mail address. You will receive e-mail notification when new information is available in MyChart. 9. Click Sign Up. You can now view your medical record.   Additional Information Remember, MyChart is NOT to be used for urgent needs. For medical emergencies, dial 911.  Laparoscopically Assisted Vaginal Hysterectomy A laparoscopically assisted vaginal hysterectomy (LAVH) is a surgical procedure to remove the uterus and cervix, and sometimes the ovaries and fallopian tubes. During an LAVH, some of the surgical removal is done through the vagina, and the rest is done through a few small surgical cuts (incisions) in the abdomen. This procedure is usually considered in women when a vaginal hysterectomy is  not an option. Your health [place] provider will discuss the risks and benefits of the different surgical techniques at your appointment. Generally, recovery time is faster and there are fewer complications after laparoscopic procedures than after open incisional procedures. Tell a health [place] provider about:  Any allergies you have.  All medicines you are taking, including vitamins, herbs, eye drops, creams, and over-the-counter medicines.  Any problems you or family members have had with anesthetic medicines.  Any blood disorders you have.  Any surgeries you have had.  Any medical conditions you have. What are the risks? Generally, this is a safe procedure. However, as with any procedure, complications can occur. Possible complications include:  Allergies to medicines.  Difficulty breathing.  Bleeding.  Infection.  Damage to other structures near your uterus and cervix. What happens before the procedure?  Ask your health [place] provider about changing or stopping your regular medicines.  Take certain medicines, such as a colon-emptying preparation, as directed.  Do not eat or drink anything for at least 8 hours before your surgery.  Stop smoking if you smoke. Stopping will improve your health after surgery.  Arrange for a ride home after surgery and for help at home during recovery. What happens during the procedure?  An IV tube will be put into one of your veins in order to give you fluids and medicines.  You will receive medicines to relax you and medicines that make you sleep (general anesthetic).  You may have a flexible tube (catheter) put into your bladder to drain urine.  You may  have a tube put through your nose or mouth that goes into your stomach (nasogastric tube). The nasogastric tube removes digestive fluids and prevents you from feeling nauseated and from vomiting.  Tight-fitting (compression) stockings will be placed on your legs to promote  circulation.  Three to four small incisions will be made in your abdomen. An incision also will be made in your vagina. Probes and tools will be inserted into the small incisions. The uterus and cervix are removed (and possibly your ovaries and fallopian tubes) through your vagina as well as through the small incisions that were made in the abdomen.  Your vagina is then sewn back to normal. What happens after the procedure?  You may have a liquid diet temporarily. You will most likely return to, and tolerate, your usual diet the day after surgery.  You will be passing urine through a catheter. It will be removed the day after surgery.  Your temperature, breathing rate, heart rate, blood pressure, and oxygen level will be monitored regularly.  You will still wear compression stockings on your legs until you are able to move around.  You will use a special device or do breathing exercises to keep your lungs clear.  You will be encouraged to walk as soon as possible. This information is not intended to replace advice given to you by your health [place] provider. Make sure you discuss any questions you have with your health [place] provider. Document Released: 08/11/2011 Document Revised: 01/28/2016 Document Reviewed: 03/07/2013 Elsevier Interactive Patient Education  2017 Elsevier Inc.   Hysterectomy Information A hysterectomy is a surgery in which your uterus is removed. This surgery may be done to treat various medical problems. After the surgery, you will no longer have menstrual periods. The surgery will also make you unable to become pregnant (sterile). The fallopian tubes and ovaries can be removed (bilateral salpingo-oophorectomy) during this surgery as well. Reasons for a hysterectomy  Persistent, abnormal bleeding.  Lasting (chronic) pelvic pain or infection.  The lining of the uterus (endometrium) starts growing outside the uterus (endometriosis).  The endometrium starts growing in  the muscle of the uterus (adenomyosis).  The uterus falls down into the vagina (pelvic organ prolapse).  Noncancerous growths in the uterus (uterine fibroids) that cause symptoms.  Precancerous cells.  Cervical cancer or uterine cancer. Types of hysterectomies  Supracervical hysterectomy-In this type, the top part of the uterus is removed, but not the cervix.  Total hysterectomy-The uterus and cervix are removed.  Radical hysterectomy-The uterus, the cervix, and the fibrous tissue that holds the uterus in place in the pelvis (parametrium) are removed. Ways a hysterectomy can be performed  Abdominal hysterectomy-A large surgical cut (incision) is made in the abdomen. The uterus is removed through this incision.  Vaginal hysterectomy-An incision is made in the vagina. The uterus is removed through this incision. There are no abdominal incisions.  Conventional laparoscopic hysterectomy-Three or four small incisions are made in the abdomen. A thin, lighted tube with a camera (laparoscope) is inserted into one of the incisions. Other tools are put through the other incisions. The uterus is cut into small pieces. The small pieces are removed through the incisions, or they are removed through the vagina.  Laparoscopically assisted vaginal hysterectomy (LAVH)-Three small incisions are made in the abdomen. Part of the surgery is performed laparoscopically and part vaginally. The uterus is removed through the vagina.  Robot-assisted laparoscopic hysterectomy-A laparoscope and other tools are inserted into 3 or 4 small incisions in the  abdomen. A computer-controlled device is used to give the surgeon a 3D image and to help control the surgical instruments. This allows for more precise movements of surgical instruments. The uterus is cut into small pieces and removed through the incisions or removed through the vagina. What are the risks? Possible complications associated with this procedure  include:  Bleeding and risk of blood transfusion. Tell your health [place] provider if you do not want to receive any blood products.  Blood clots in the legs or lung.  Infection.  Injury to surrounding organs.  Problems or side effects related to anesthesia.  Conversion to an abdominal hysterectomy from one of the other techniques. What to expect after a hysterectomy  You will be given pain medicine.  You will need to have someone with you for the first 3-5 days after you go home.  You will need to follow up with your surgeon in 2-4 weeks after surgery to evaluate your progress.  You may have early menopause symptoms such as hot flashes, night sweats, and insomnia.  If you had a hysterectomy for a problem that was not cancer or not a condition that could lead to cancer, then you no longer need Pap tests. However, even if you no longer need a Pap test, a regular exam is a good idea to make sure no other problems are starting. This information is not intended to replace advice given to you by your health [place] provider. Make sure you discuss any questions you have with your health [place] provider. Document Released: 02/15/2001 Document Revised: 01/28/2016 Document Reviewed: 04/29/2013 Elsevier Interactive Patient Education  2017 [employer].

## 2016-10-11 ENCOUNTER — Telehealth: Payer: Self-pay | Admitting: Obstetrics & Gynecology

## 2016-10-11 ENCOUNTER — Encounter (HOSPITAL_COMMUNITY): Payer: Self-pay

## 2016-10-11 NOTE — Telephone Encounter (Signed)
Patient was called and informed of surgery date of 11/15/16; she was okay with this date.  She was told to expect a letter from the office confirming this information.  Tereso NewcomerUgonna A Jillane Po, MD

## 2016-10-11 NOTE — Telephone Encounter (Signed)
-----   Message from Garret ReddishJanice M Barnes, RN sent at 10/10/2016  4:54 PM EST ----- Hi Dr A, This patient called today wanting to know if her surgery could be expedited. She is planning to travel and has family plans coming up and is hoping to have the surgery before that happens. Can we help her with this? She would like a call back. Thanks, Liborio NixonJanice

## 2016-11-02 NOTE — Patient Instructions (Signed)
Your procedure is scheduled on:  Tuesday, November 15, 2016  [facility] at:  11:00 AM  Pick up the phone at the desk and dial [phone].  Call this number if you have problems the morning of surgery: [phone].  Remember: Do NOT eat food:  After Midnight Monday, November 14, 2016  Do NOT drink clear liquids after:  8:30 AM day of surgery  Take these medicines the morning of surgery with a SIP OF WATER:  Bupropion  Stop ALL herbal medications at this time  Do NOT smoke the day of surgery.  Do NOT wear jewelry (body piercing), metal hair clips/bobby pins, make-up, or nail polish. Do NOT wear lotions, powders, or perfumes.  You may wear deodorant. Do NOT shave for 48 hours prior to surgery. Do NOT bring valuables to the hospital. Contacts, dentures, or bridgework may not be worn into surgery.  Leave suitcase in car.  After surgery it may be brought to your room.  For patients admitted to the hospital, checkout time is 11:00 AM the day of discharge.  Bring a copy of your healthcare power of attorney and living will documents.  **Effective Friday, Jan. 12, 2018, [place] will implement no hospital visitations from children age 48 and younger due to a steady increase in flu activity in our community and hospitals. **

## 2016-11-03 ENCOUNTER — Other Ambulatory Visit (HOSPITAL_COMMUNITY)
Admission: RE | Admit: 2016-11-03 | Discharge: 2016-11-03 | Disposition: A | Discharge status: Home / Self Care | Payer: BLUE CROSS/BLUE SHIELD | Source: Ambulatory Visit | Attending: Obstetrics & Gynecology | Admitting: Obstetrics & Gynecology

## 2016-11-03 ENCOUNTER — Encounter (HOSPITAL_COMMUNITY)
Admission: RE | Admit: 2016-11-03 | Discharge: 2016-11-03 | Disposition: A | Discharge status: Home / Self Care | Payer: BLUE CROSS/BLUE SHIELD | Source: Ambulatory Visit | Attending: Obstetrics & Gynecology | Admitting: Obstetrics & Gynecology

## 2016-11-03 ENCOUNTER — Ambulatory Visit (INDEPENDENT_AMBULATORY_CARE_PROVIDER_SITE_OTHER): Payer: BLUE CROSS/BLUE SHIELD | Admitting: Obstetrics & Gynecology

## 2016-11-03 ENCOUNTER — Encounter (HOSPITAL_COMMUNITY): Payer: Self-pay

## 2016-11-03 ENCOUNTER — Encounter: Payer: Self-pay | Admitting: Obstetrics & Gynecology

## 2016-11-03 VITALS — BP 153/69 | HR 67 | Ht 65.5 in | Wt 187.8 lb

## 2016-11-03 DIAGNOSIS — Z01818 Encounter for other preprocedural examination: Secondary | ICD-10-CM | POA: Diagnosis present

## 2016-11-03 DIAGNOSIS — N939 Abnormal uterine and vaginal bleeding, unspecified: Secondary | ICD-10-CM | POA: Insufficient documentation

## 2016-11-03 DIAGNOSIS — Z124 Encounter for screening for malignant neoplasm of cervix: Secondary | ICD-10-CM | POA: Diagnosis not present

## 2016-11-03 DIAGNOSIS — Z3202 Encounter for pregnancy test, result negative: Secondary | ICD-10-CM

## 2016-11-03 DIAGNOSIS — Z01812 Encounter for preprocedural laboratory examination: Secondary | ICD-10-CM | POA: Diagnosis present

## 2016-11-03 DIAGNOSIS — R102 Pelvic and perineal pain: Secondary | ICD-10-CM | POA: Insufficient documentation

## 2016-11-03 DIAGNOSIS — Z0183 Encounter for blood typing: Secondary | ICD-10-CM | POA: Diagnosis not present

## 2016-11-03 DIAGNOSIS — Z1151 Encounter for screening for human papillomavirus (HPV): Secondary | ICD-10-CM

## 2016-11-03 HISTORY — DX: Headache: R51

## 2016-11-03 HISTORY — DX: Headache, unspecified: R51.9

## 2016-11-03 LAB — CBC
HCT: 37.8 % (ref 36.0–46.0)
Hemoglobin: 13.2 g/dL (ref 12.0–15.0)
MCH: 31 pg (ref 26.0–34.0)
MCHC: 34.9 g/dL (ref 30.0–36.0)
MCV: 88.7 fL (ref 78.0–100.0)
PLATELETS: 279 10*3/uL (ref 150–400)
RBC: 4.26 MIL/uL (ref 3.87–5.11)
RDW: 13.5 % (ref 11.5–15.5)
WBC: 7.6 10*3/uL (ref 4.0–10.5)

## 2016-11-03 LAB — TYPE AND SCREEN
ABO/RH(D): O POS
ANTIBODY SCREEN: NEGATIVE

## 2016-11-03 LAB — ABO/RH: ABO/RH(D): O POS

## 2016-11-03 NOTE — Progress Notes (Signed)
   GYNECOLOGY OFFICE PROCEDURE NOTE  46 y.o. [id number] here today for preoperative pap smear and endometrial biopsy. Scheduled for LAVH on 11/15/16.  She denies any concerns today.    Objective:  Physical Exam BP (!) 153/69   Pulse 67   Ht 5' 5.5" (1.664 m)   Wt 187 lb 12.8 oz (85.2 kg)   LMP 10/25/2016 (Approximate)   BMI 30.78 kg/m  CONSTITUTIONAL: Well-developed, well-nourished female in no acute distress.  HENT:  Normocephalic, atraumatic. External right and left ear normal. Oropharynx is clear and moist EYES: Conjunctivae and EOM are normal. Pupils are equal, round, and reactive to light. No scleral icterus.  NECK: Normal range of motion, supple, no masses SKIN: Skin is warm and dry. No rash noted. Not diaphoretic. No erythema. No pallor. NEUROLOGIC: Alert and oriented to person, place, and time. Normal reflexes, muscle tone coordination. No cranial nerve deficit noted. PSYCHIATRIC: Normal mood and affect. Normal behavior. Normal judgment and thought content. CARDIOVASCULAR: Normal heart rate noted RESPIRATORY: Effort and breath sounds normal, no problems with respiration noted ABDOMEN: Soft, no distention noted.   PELVIC: Normal appearing external genitalia; normal appearing vaginal mucosa and cervix.  No abnormal discharge noted.  Pap done.  Normal uterine size, no other palpable masses, no uterine or adnexal tenderness. MUSCULOSKELETAL: Normal range of motion. No edema noted.  ENDOMETRIAL BIOPSY     The indications for endometrial biopsy were reviewed.   Risks of the biopsy including cramping, bleeding, infection, uterine perforation, inadequate specimen and need for additional procedures  were discussed. The patient states she understands and agrees to undergo procedure today. Consent was signed. Time out was performed. Urine HCG was negative. During the pelvic exam, the cervix was prepped with Betadine. A single-toothed tenaculum was placed on the anterior lip of the cervix to  stabilize it. The 3 mm pipelle was introduced into the endometrial cavity without difficulty to a depth of 7 cm, and a moderate amount of tissue was obtained and sent to pathology. The instruments were removed from the patient's vagina. Minimal bleeding from the cervix was noted. The patient tolerated the procedure well. Routine post-procedure instructions were given to the patient.     Assessment & Plan:  1. Abnormal uterine bleeding (AUB) 2. Preoperative exam for gynecologic surgery - Cytology - PAP - Surgical pathology Will follow up results and manage accordingly. Preop visit today with Anesthesia team. Routine preventative health maintenance measures emphasized. Please refer to After Visit Summary for other counseling recommendations.  Return in about 6 weeks (around 12/15/2016) for Postoperative check after hysterectomy.   Jaynie CollinsUGONNA  Adylee Leonardo, MD, FACOG Attending Obstetrician & Gynecologist, [facility], [facility]

## 2016-11-03 NOTE — Patient Instructions (Addendum)
Return to clinic for any scheduled appointments or for any gynecologic concerns as needed.    Laparoscopically Assisted Vaginal Hysterectomy A laparoscopically assisted vaginal hysterectomy (LAVH) is a surgical procedure to remove the uterus and cervix, and sometimes the ovaries and fallopian tubes. During an LAVH, some of the surgical removal is done through the vagina, and the rest is done through a few small surgical cuts (incisions) in the abdomen. This procedure is usually considered in women when a vaginal hysterectomy is not an option. Your health [place] provider will discuss the risks and benefits of the different surgical techniques at your appointment. Generally, recovery time is faster and there are fewer complications after laparoscopic procedures than after open incisional procedures. Tell a health [place] provider about:  Any allergies you have.  All medicines you are taking, including vitamins, herbs, eye drops, creams, and over-the-counter medicines.  Any problems you or family members have had with anesthetic medicines.  Any blood disorders you have.  Any surgeries you have had.  Any medical conditions you have. What are the risks? Generally, this is a safe procedure. However, as with any procedure, complications can occur. Possible complications include:  Allergies to medicines.  Difficulty breathing.  Bleeding.  Infection.  Damage to other structures near your uterus and cervix. What happens before the procedure?  Ask your health [place] provider about changing or stopping your regular medicines.  Take certain medicines, such as a colon-emptying preparation, as directed.  Do not eat or drink anything for at least 8 hours before your surgery.  Stop smoking if you smoke. Stopping will improve your health after surgery.  Arrange for a ride home after surgery and for help at home during recovery. What happens during the procedure?  An IV tube will be put into one of  your veins in order to give you fluids and medicines.  You will receive medicines to relax you and medicines that make you sleep (general anesthetic).  You may have a flexible tube (catheter) put into your bladder to drain urine.  You may have a tube put through your nose or mouth that goes into your stomach (nasogastric tube). The nasogastric tube removes digestive fluids and prevents you from feeling nauseated and from vomiting.  Tight-fitting (compression) stockings will be placed on your legs to promote circulation.  Three to four small incisions will be made in your abdomen. An incision also will be made in your vagina. Probes and tools will be inserted into the small incisions. The uterus and cervix are removed (and possibly your ovaries and fallopian tubes) through your vagina as well as through the small incisions that were made in the abdomen.  Your vagina is then sewn back to normal. What happens after the procedure?  You may have a liquid diet temporarily. You will most likely return to, and tolerate, your usual diet the day after surgery.  You will be passing urine through a catheter. It will be removed the day after surgery.  Your temperature, breathing rate, heart rate, blood pressure, and oxygen level will be monitored regularly.  You will still wear compression stockings on your legs until you are able to move around.  You will use a special device or do breathing exercises to keep your lungs clear.  You will be encouraged to walk as soon as possible. This information is not intended to replace advice given to you by your health [place] provider. Make sure you discuss any questions you have with your health [place] provider.  Document Released: 08/11/2011 Document Revised: 01/28/2016 Document Reviewed: 03/07/2013 Elsevier Interactive Patient Education  2017 [employer].

## 2016-11-04 LAB — POCT PREGNANCY, URINE: PREG TEST UR: NEGATIVE

## 2016-11-07 ENCOUNTER — Telehealth: Payer: Self-pay

## 2016-11-07 LAB — CYTOLOGY - PAP
DIAGNOSIS: NEGATIVE
HPV: NOT DETECTED

## 2016-11-07 NOTE — Telephone Encounter (Signed)
Patient has been informed of lab results.

## 2016-11-07 NOTE — Telephone Encounter (Signed)
-----   Message from Tereso NewcomerUgonna A Anyanwu, MD sent at 11/04/2016  2:54 PM EST ----- Benign endometrial biopsy.  Please call to inform patient of results.

## 2016-11-15 ENCOUNTER — Observation Stay (HOSPITAL_COMMUNITY)
Admission: RE | Admit: 2016-11-15 | Discharge: 2016-11-16 | Disposition: A | Discharge status: Home / Self Care | Payer: BLUE CROSS/BLUE SHIELD | Source: Ambulatory Visit | Attending: Obstetrics & Gynecology | Admitting: Obstetrics & Gynecology

## 2016-11-15 ENCOUNTER — Ambulatory Visit (HOSPITAL_COMMUNITY): Payer: BLUE CROSS/BLUE SHIELD | Admitting: Anesthesiology

## 2016-11-15 ENCOUNTER — Encounter (HOSPITAL_COMMUNITY): Payer: Self-pay | Admitting: *Deleted

## 2016-11-15 ENCOUNTER — Encounter (HOSPITAL_COMMUNITY)
Admission: RE | Disposition: A | Discharge status: Home / Self Care | Payer: Self-pay | Source: Ambulatory Visit | Attending: Obstetrics & Gynecology

## 2016-11-15 DIAGNOSIS — F329 Major depressive disorder, single episode, unspecified: Secondary | ICD-10-CM | POA: Diagnosis not present

## 2016-11-15 DIAGNOSIS — Z90710 Acquired absence of both cervix and uterus: Secondary | ICD-10-CM

## 2016-11-15 DIAGNOSIS — G8929 Other chronic pain: Principal | ICD-10-CM | POA: Insufficient documentation

## 2016-11-15 DIAGNOSIS — N938 Other specified abnormal uterine and vaginal bleeding: Secondary | ICD-10-CM

## 2016-11-15 DIAGNOSIS — R102 Pelvic and perineal pain: Secondary | ICD-10-CM | POA: Diagnosis present

## 2016-11-15 DIAGNOSIS — Z9889 Other specified postprocedural states: Secondary | ICD-10-CM | POA: Diagnosis not present

## 2016-11-15 DIAGNOSIS — N939 Abnormal uterine and vaginal bleeding, unspecified: Secondary | ICD-10-CM | POA: Diagnosis present

## 2016-11-15 DIAGNOSIS — N7011 Chronic salpingitis: Secondary | ICD-10-CM | POA: Diagnosis not present

## 2016-11-15 HISTORY — PX: LAPAROSCOPIC VAGINAL HYSTERECTOMY WITH SALPINGECTOMY: SHX6680

## 2016-11-15 LAB — PREGNANCY, URINE: Preg Test, Ur: NEGATIVE

## 2016-11-15 SURGERY — HYSTERECTOMY, VAGINAL, LAPAROSCOPY-ASSISTED, WITH SALPINGECTOMY
Anesthesia: General | Site: Abdomen | Laterality: Bilateral

## 2016-11-15 MED ORDER — ZOLPIDEM TARTRATE 5 MG PO TABS: 5.0000 mg | ORAL_TABLET | Freq: Every evening | ORAL | Status: DC | PRN

## 2016-11-15 MED ORDER — KETOROLAC TROMETHAMINE 30 MG/ML IJ SOLN
30.0000 mg | Freq: Once | INTRAMUSCULAR | Status: DC
  Administered 2016-11-15: 30 mg via INTRAVENOUS

## 2016-11-15 MED ORDER — ALUM & MAG HYDROXIDE-SIMETH 200-200-20 MG/5ML PO SUSP: 30.0000 mL | ORAL | Status: DC | PRN

## 2016-11-15 MED ORDER — DOCUSATE SODIUM 100 MG PO CAPS
100.0000 mg | ORAL_CAPSULE | Freq: Two times a day (BID) | ORAL | Status: DC
  Administered 2016-11-15 – 2016-11-16 (×2): 100 mg via ORAL
  Filled 2016-11-15 (×2): qty 1

## 2016-11-15 MED ORDER — SUGAMMADEX SODIUM 200 MG/2ML IV SOLN
INTRAVENOUS | Status: DC | PRN
  Administered 2016-11-15: 200 mg via INTRAVENOUS

## 2016-11-15 MED ORDER — PROPOFOL 10 MG/ML IV BOLUS
INTRAVENOUS | Status: DC | PRN
  Administered 2016-11-15: 200 mg via INTRAVENOUS

## 2016-11-15 MED ORDER — ONDANSETRON HCL 4 MG/2ML IJ SOLN
4.0000 mg | Freq: Four times a day (QID) | INTRAMUSCULAR | Status: DC | PRN
  Administered 2016-11-15: 4 mg via INTRAVENOUS
  Filled 2016-11-15: qty 2

## 2016-11-15 MED ORDER — BISACODYL 10 MG RE SUPP: 10.0000 mg | Freq: Every day | RECTAL | Status: DC | PRN

## 2016-11-15 MED ORDER — SUGAMMADEX SODIUM 200 MG/2ML IV SOLN
INTRAVENOUS | Status: AC
  Filled 2016-11-15: qty 2

## 2016-11-15 MED ORDER — GLYCOPYRROLATE 0.2 MG/ML IJ SOLN
INTRAMUSCULAR | Status: DC | PRN
  Administered 2016-11-15: 0.2 mg via INTRAVENOUS

## 2016-11-15 MED ORDER — SIMETHICONE 80 MG PO CHEW
80.0000 mg | CHEWABLE_TABLET | Freq: Four times a day (QID) | ORAL | Status: DC | PRN
  Administered 2016-11-16: 80 mg via ORAL
  Filled 2016-11-15 (×2): qty 1

## 2016-11-15 MED ORDER — DEXAMETHASONE SODIUM PHOSPHATE 4 MG/ML IJ SOLN
INTRAMUSCULAR | Status: AC
  Filled 2016-11-15: qty 1

## 2016-11-15 MED ORDER — HYDROMORPHONE HCL 1 MG/ML IJ SOLN
INTRAMUSCULAR | Status: DC | PRN
  Administered 2016-11-15: 1 mg via INTRAVENOUS

## 2016-11-15 MED ORDER — KETOROLAC TROMETHAMINE 30 MG/ML IJ SOLN
INTRAMUSCULAR | Status: AC
  Filled 2016-11-15: qty 1

## 2016-11-15 MED ORDER — LACTATED RINGERS IV SOLN
INTRAVENOUS | Status: DC
  Administered 2016-11-15 (×2): 125 mL/h via INTRAVENOUS

## 2016-11-15 MED ORDER — BUPIVACAINE-EPINEPHRINE 0.5% -1:200000 IJ SOLN
INTRAMUSCULAR | Status: DC | PRN
  Administered 2016-11-15: 18 mL

## 2016-11-15 MED ORDER — BUPIVACAINE-EPINEPHRINE (PF) 0.5% -1:200000 IJ SOLN
INTRAMUSCULAR | Status: AC
  Filled 2016-11-15: qty 30

## 2016-11-15 MED ORDER — PROPOFOL 10 MG/ML IV BOLUS
INTRAVENOUS | Status: AC
  Filled 2016-11-15: qty 20

## 2016-11-15 MED ORDER — LACTATED RINGERS IV SOLN
INTRAVENOUS | Status: DC
  Administered 2016-11-15 (×2): via INTRAVENOUS

## 2016-11-15 MED ORDER — ROCURONIUM BROMIDE 100 MG/10ML IV SOLN
INTRAVENOUS | Status: AC
  Filled 2016-11-15: qty 1

## 2016-11-15 MED ORDER — GLYCOPYRROLATE 0.2 MG/ML IJ SOLN
INTRAMUSCULAR | Status: AC
  Filled 2016-11-15: qty 1

## 2016-11-15 MED ORDER — MIDAZOLAM HCL 2 MG/2ML IJ SOLN
INTRAMUSCULAR | Status: DC | PRN
  Administered 2016-11-15: 2 mg via INTRAVENOUS

## 2016-11-15 MED ORDER — SENNOSIDES-DOCUSATE SODIUM 8.6-50 MG PO TABS: 1.0000 | ORAL_TABLET | Freq: Every evening | ORAL | Status: DC | PRN

## 2016-11-15 MED ORDER — FENTANYL CITRATE (PF) 250 MCG/5ML IJ SOLN
INTRAMUSCULAR | Status: AC
  Filled 2016-11-15: qty 5

## 2016-11-15 MED ORDER — MEPERIDINE HCL 25 MG/ML IJ SOLN: 6.2500 mg | INTRAMUSCULAR | Status: DC | PRN

## 2016-11-15 MED ORDER — FENTANYL CITRATE (PF) 100 MCG/2ML IJ SOLN
INTRAMUSCULAR | Status: DC | PRN
  Administered 2016-11-15: 100 ug via INTRAVENOUS
  Administered 2016-11-15 (×2): 50 ug via INTRAVENOUS
  Administered 2016-11-15 (×2): 100 ug via INTRAVENOUS
  Administered 2016-11-15 (×2): 50 ug via INTRAVENOUS

## 2016-11-15 MED ORDER — CEFOTETAN DISODIUM-DEXTROSE 2-2.08 GM-% IV SOLR
INTRAVENOUS | Status: AC
  Filled 2016-11-15: qty 50

## 2016-11-15 MED ORDER — HYDROMORPHONE HCL 1 MG/ML IJ SOLN
1.0000 mg | INTRAMUSCULAR | Status: DC | PRN
  Administered 2016-11-15: 2 mg via INTRAVENOUS
  Administered 2016-11-15: 1 mg via INTRAVENOUS
  Filled 2016-11-15: qty 1
  Filled 2016-11-15: qty 2

## 2016-11-15 MED ORDER — ONDANSETRON HCL 4 MG PO TABS: 4.0000 mg | ORAL_TABLET | Freq: Four times a day (QID) | ORAL | Status: DC | PRN

## 2016-11-15 MED ORDER — OXYCODONE-ACETAMINOPHEN 5-325 MG PO TABS
1.0000 | ORAL_TABLET | ORAL | Status: DC | PRN
  Administered 2016-11-15 – 2016-11-16 (×5): 2 via ORAL
  Filled 2016-11-15 (×5): qty 2

## 2016-11-15 MED ORDER — PANTOPRAZOLE SODIUM 40 MG PO TBEC
40.0000 mg | DELAYED_RELEASE_TABLET | Freq: Every day | ORAL | Status: DC
  Administered 2016-11-15 – 2016-11-16 (×2): 40 mg via ORAL
  Filled 2016-11-15 (×2): qty 1

## 2016-11-15 MED ORDER — HYDROMORPHONE HCL 1 MG/ML IJ SOLN
INTRAMUSCULAR | Status: AC
  Filled 2016-11-15: qty 1

## 2016-11-15 MED ORDER — ONDANSETRON HCL 4 MG/2ML IJ SOLN
INTRAMUSCULAR | Status: DC | PRN
  Administered 2016-11-15: 4 mg via INTRAVENOUS

## 2016-11-15 MED ORDER — BUPROPION HCL ER (SR) 150 MG PO TB12
150.0000 mg | ORAL_TABLET | Freq: Two times a day (BID) | ORAL | Status: DC
  Administered 2016-11-15 – 2016-11-16 (×2): 150 mg via ORAL
  Filled 2016-11-15 (×4): qty 1

## 2016-11-15 MED ORDER — SCOPOLAMINE 1 MG/3DAYS TD PT72
MEDICATED_PATCH | TRANSDERMAL | Status: AC
  Administered 2016-11-15: 1.5 mg via TRANSDERMAL
  Filled 2016-11-15: qty 1

## 2016-11-15 MED ORDER — PROMETHAZINE HCL 25 MG/ML IJ SOLN: 6.2500 mg | INTRAMUSCULAR | Status: DC | PRN

## 2016-11-15 MED ORDER — BUPIVACAINE HCL (PF) 0.5 % IJ SOLN
INTRAMUSCULAR | Status: DC | PRN
  Administered 2016-11-15: 6 mL

## 2016-11-15 MED ORDER — CEFOTETAN DISODIUM-DEXTROSE 2-2.08 GM-% IV SOLR
2.0000 g | INTRAVENOUS | Status: AC
  Administered 2016-11-15: 2 g via INTRAVENOUS

## 2016-11-15 MED ORDER — LIDOCAINE HCL (CARDIAC) 20 MG/ML IV SOLN
INTRAVENOUS | Status: DC | PRN
  Administered 2016-11-15: 80 mg via INTRAVENOUS

## 2016-11-15 MED ORDER — HYDROMORPHONE HCL 1 MG/ML IJ SOLN
INTRAMUSCULAR | Status: AC
  Administered 2016-11-15: 17:00:00
  Filled 2016-11-15: qty 1

## 2016-11-15 MED ORDER — KETOROLAC TROMETHAMINE 30 MG/ML IJ SOLN
INTRAMUSCULAR | Status: DC | PRN
  Administered 2016-11-15: 30 mg via INTRAVENOUS

## 2016-11-15 MED ORDER — ONDANSETRON HCL 4 MG/2ML IJ SOLN
INTRAMUSCULAR | Status: AC
  Filled 2016-11-15: qty 2

## 2016-11-15 MED ORDER — MIDAZOLAM HCL 2 MG/2ML IJ SOLN
INTRAMUSCULAR | Status: AC
  Filled 2016-11-15: qty 2

## 2016-11-15 MED ORDER — MAGNESIUM CITRATE PO SOLN
1.0000 | Freq: Once | ORAL | Status: DC | PRN
  Filled 2016-11-15: qty 296

## 2016-11-15 MED ORDER — MENTHOL 3 MG MT LOZG: 1.0000 | LOZENGE | OROMUCOSAL | Status: DC | PRN

## 2016-11-15 MED ORDER — ROCURONIUM BROMIDE 100 MG/10ML IV SOLN
INTRAVENOUS | Status: DC | PRN
  Administered 2016-11-15: 10 mg via INTRAVENOUS
  Administered 2016-11-15: 50 mg via INTRAVENOUS
  Administered 2016-11-15 (×2): 20 mg via INTRAVENOUS

## 2016-11-15 MED ORDER — HYDROMORPHONE HCL 1 MG/ML IJ SOLN
0.2500 mg | INTRAMUSCULAR | Status: DC | PRN
  Administered 2016-11-15 (×2): 0.5 mg via INTRAVENOUS

## 2016-11-15 MED ORDER — IBUPROFEN 600 MG PO TABS
600.0000 mg | ORAL_TABLET | Freq: Four times a day (QID) | ORAL | Status: DC | PRN
  Administered 2016-11-16 (×2): 600 mg via ORAL
  Filled 2016-11-15 (×3): qty 1

## 2016-11-15 MED ORDER — BUPIVACAINE HCL (PF) 0.5 % IJ SOLN
INTRAMUSCULAR | Status: AC
  Filled 2016-11-15: qty 30

## 2016-11-15 MED ORDER — SCOPOLAMINE 1 MG/3DAYS TD PT72
1.0000 | MEDICATED_PATCH | Freq: Once | TRANSDERMAL | Status: DC
  Administered 2016-11-15: 1.5 mg via TRANSDERMAL

## 2016-11-15 MED ORDER — DEXAMETHASONE SODIUM PHOSPHATE 10 MG/ML IJ SOLN
INTRAMUSCULAR | Status: DC | PRN
  Administered 2016-11-15: 4 mg via INTRAVENOUS

## 2016-11-15 MED ORDER — LIDOCAINE HCL (CARDIAC) 20 MG/ML IV SOLN
INTRAVENOUS | Status: AC
  Filled 2016-11-15: qty 5

## 2016-11-15 SURGICAL SUPPLY — 43 items
ADH SKN CLS APL DERMABOND .7 (GAUZE/BANDAGES/DRESSINGS) ×1
APPLICATOR COTTON TIP 6IN STRL (MISCELLANEOUS) ×3 IMPLANT
CABLE HIGH FREQUENCY MONO STRZ (ELECTRODE) IMPLANT
CANISTER SUCT 3000ML (MISCELLANEOUS) ×3 IMPLANT
CLOTH BEACON ORANGE TIMEOUT ST (SAFETY) ×3 IMPLANT
CONT PATH 16OZ SNAP LID 3702 (MISCELLANEOUS) ×3 IMPLANT
COVER BACK TABLE 60X90IN (DRAPES) ×3 IMPLANT
DECANTER SPIKE VIAL GLASS SM (MISCELLANEOUS) ×6 IMPLANT
DERMABOND ADVANCED (GAUZE/BANDAGES/DRESSINGS) ×2
DERMABOND ADVANCED .7 DNX12 (GAUZE/BANDAGES/DRESSINGS) IMPLANT
DRSG OPSITE POSTOP 3X4 (GAUZE/BANDAGES/DRESSINGS) ×3 IMPLANT
DURAPREP 26ML APPLICATOR (WOUND CARE) ×3 IMPLANT
ELECT REM PT RETURN 9FT ADLT (ELECTROSURGICAL) ×3
ELECTRODE REM PT RTRN 9FT ADLT (ELECTROSURGICAL) IMPLANT
FILTER SMOKE EVAC LAPAROSHD (FILTER) ×3 IMPLANT
GLOVE BIOGEL PI IND STRL 6.5 (GLOVE) ×1 IMPLANT
GLOVE BIOGEL PI IND STRL 7.0 (GLOVE) ×4 IMPLANT
GLOVE BIOGEL PI INDICATOR 6.5 (GLOVE) ×2
GLOVE BIOGEL PI INDICATOR 7.0 (GLOVE) ×8
GLOVE ECLIPSE 7.0 STRL STRAW (GLOVE) ×6 IMPLANT
LEGGING LITHOTOMY PAIR STRL (DRAPES) ×3 IMPLANT
NDL MAYO CATGUT SZ4 TPR NDL (NEEDLE) IMPLANT
NEEDLE MAYO CATGUT SZ4 (NEEDLE) IMPLANT
NS IRRIG 1000ML POUR BTL (IV SOLUTION) ×3 IMPLANT
PACK LAVH (CUSTOM PROCEDURE TRAY) ×3 IMPLANT
PACK ROBOTIC GOWN (GOWN DISPOSABLE) ×3 IMPLANT
PACK TRENDGUARD 450 HYBRID PRO (MISCELLANEOUS) IMPLANT
PROTECTOR NERVE ULNAR (MISCELLANEOUS) ×6 IMPLANT
SET IRRIG TUBING LAPAROSCOPIC (IRRIGATION / IRRIGATOR) IMPLANT
SHEARS HARMONIC ACE PLUS 36CM (ENDOMECHANICALS) ×2 IMPLANT
SLEEVE XCEL OPT CAN 5 100 (ENDOMECHANICALS) ×5 IMPLANT
SUT VIC AB 0 CT1 18XCR BRD8 (SUTURE) ×2 IMPLANT
SUT VIC AB 0 CT1 36 (SUTURE) ×3 IMPLANT
SUT VIC AB 0 CT1 8-18 (SUTURE) ×6
SUT VICRYL 0 TIES 12 18 (SUTURE) ×3 IMPLANT
SUT VICRYL 0 UR6 27IN ABS (SUTURE) ×3 IMPLANT
SUT VICRYL 4-0 PS2 18IN ABS (SUTURE) ×6 IMPLANT
TOWEL OR 17X24 6PK STRL BLUE (TOWEL DISPOSABLE) ×9 IMPLANT
TRAY FOLEY CATH SILVER 14FR (SET/KITS/TRAYS/PACK) ×3 IMPLANT
TRENDGUARD 450 HYBRID PRO PACK (MISCELLANEOUS)
TROCAR XCEL NON-BLD 11X100MML (ENDOMECHANICALS) IMPLANT
TROCAR XCEL NON-BLD 5MMX100MML (ENDOMECHANICALS) ×3 IMPLANT
WARMER LAPAROSCOPE (MISCELLANEOUS) ×3 IMPLANT

## 2016-11-15 NOTE — Transfer of Care (Signed)
Immediate Anesthesia Transfer of [place] Note  Patient: Ruth Santana  Procedure(s) Performed: Procedure(s): LAPAROSCOPIC ASSISTED VAGINAL HYSTERECTOMY WITH SALPINGECTOMY (Bilateral)  Patient Location: PACU  Anesthesia Type:General  Level of Consciousness: awake, alert  and oriented  Airway & Oxygen Therapy: Patient Spontanous Breathing and Patient connected to nasal cannula oxygen  Post-op Assessment: Report given to RN and Post -op Vital signs reviewed and stable  Post vital signs: Reviewed and stable  Last Vitals:  Vitals:   11/15/16 0951  BP: 134/86  Pulse: 61  Resp: 18  Temp: 36.6 C    Last Pain:  Vitals:   11/15/16 0951  TempSrc: Oral  PainSc: 3       Patients Stated Pain Goal: 3 (11/15/16 0951)  Complications: No apparent anesthesia complications

## 2016-11-15 NOTE — Anesthesia Preprocedure Evaluation (Signed)
Anesthesia Evaluation  Patient identified by MRN, date of birth, ID band Patient awake    Reviewed: Allergy & Precautions, NPO status , Patient's Chart, lab work & pertinent test results  History of Anesthesia Complications Negative for: history of anesthetic complications  Airway Mallampati: II  TM Distance: >3 FB Neck ROM: Full    Dental no notable dental hx. (+) Dental Advisory Given   Pulmonary neg pulmonary ROS,    Pulmonary exam normal breath sounds clear to auscultation       Cardiovascular negative cardio ROS Normal cardiovascular exam Rhythm:Regular Rate:Normal     Neuro/Psych PSYCHIATRIC DISORDERS Anxiety Depression negative neurological ROS     GI/Hepatic negative GI ROS, Neg liver ROS,   Endo/Other  negative endocrine ROS  Renal/GU negative Renal ROS  Female GU complaint     Musculoskeletal negative musculoskeletal ROS (+)   Abdominal Normal abdominal exam  (+)   Peds  Hematology negative hematology ROS (+)   Anesthesia Other Findings   Reproductive/Obstetrics negative OB ROS                             Anesthesia Physical  Anesthesia Plan  ASA: II  Anesthesia Plan: General   Post-op Pain Management:    Induction: Intravenous  Airway Management Planned: Oral ETT  Additional Equipment:   Intra-op Plan:   Post-operative Plan: Extubation in OR  Informed Consent: I have reviewed the patients History and Physical, chart, labs and discussed the procedure including the risks, benefits and alternatives for the proposed anesthesia with the patient or authorized representative who has indicated his/her understanding and acceptance.   Dental advisory given  Plan Discussed with: CRNA  Anesthesia Plan Comments:         Anesthesia Quick Evaluation

## 2016-11-15 NOTE — H&P (Signed)
Preoperative History and Physical  [facility] is a 46 y.o. [phone] here for surgical management of management of chronic pelvic pain and recurrence of AUB s/p endometrial ablation on 09/18/14.  Bleeding is sporadic, heavy at times, not comparable to what it was prior to HTA.  Pain is more constant, in lower abdomen, seems to be related in intensity to the bleeding.  Ibuprofen does not help with the pain.  She is tired of all the pain and bleeding, and desires definitive surgical management.   No significant preoperative concerns.  Proposed surgery: Laparoscopic-assisted vaginal hysterectomy (LAVH) and prophylactic bilateral salpingectomy  Past Medical History:  Diagnosis Date  . Allergy   . Depression   . Headache    history of migraines  . Irregular periods   . Menorrhagia   . Pelvic pain in female   . PTSD (post-traumatic stress disorder)    Past Surgical History:  Procedure Laterality Date  . CESAREAN SECTION  1992  . DILATION AND CURETTAGE OF UTERUS    . DILITATION & CURRETTAGE/HYSTROSCOPY WITH HYDROTHERMAL ABLATION N/A 09/18/2014   Procedure: DILATATION & CURETTAGE/HYSTEROSCOPY WITH HYDROTHERMAL ABLATION;  Surgeon: Tereso Newcomer, MD;  Location: [facility];  Service: Gynecology;  Laterality: N/A;  . KNEE SURGERY    . TUBAL LIGATION  1998  . WISDOM TOOTH EXTRACTION     OB History  Gravida Para Term Preterm AB Living  4 3 3   1 3   SAB TAB Ectopic Multiple Live Births  1            # Outcome Date GA Lbr Len/2nd Weight Sex Delivery Anes PTL Lv  4 SAB           3 Term           2 Term           1 Term             Patient denies any other pertinent gynecologic issues.   No current facility-administered medications on file prior to encounter.    Current Outpatient Prescriptions on File Prior to Encounter  Medication Sig Dispense Refill  . buPROPion (WELLBUTRIN SR) 150 MG 12 hr tablet TAKE ONE TABLET BY MOUTH TWICE DAILY 60 tablet 10  . ibuprofen (ADVIL,MOTRIN) 800 MG  tablet Take 1 tablet (800 mg total) by mouth 3 (three) times daily with meals as needed for headache or moderate pain. (Patient taking differently: Take 800 mg by mouth daily as needed for headache or moderate pain. ) 30 tablet 3  . loratadine (CLARITIN) 10 MG tablet Take 10 mg by mouth daily.    . traMADol (ULTRAM) 50 MG tablet Take 1 tablet (50 mg total) by mouth every 6 (six) hours as needed for severe pain. (Patient taking differently: Take 50 mg by mouth every 6 (six) hours as needed for moderate pain. Pain) 30 tablet 1  . docusate sodium (COLACE) 100 MG capsule Take 1 capsule (100 mg total) by mouth 2 (two) times daily as needed. (Patient not taking: Reported on 10/22/2014) 30 capsule 2  . megestrol (MEGACE) 40 MG tablet Take 1 tablet (40 mg total) by mouth daily. Can increase to two tablets daily for heavy bleeding. (Patient not taking: Reported on 10/31/2016) 30 tablet 2  . oxyCODONE-acetaminophen (PERCOCET/ROXICET) 5-325 MG per tablet Take 1-2 tablets by mouth every 6 (six) hours as needed. (Patient not taking: Reported on 11/03/2016) 30 tablet 0   No Known Allergies  Social History:   reports that she  has never smoked. She has never used smokeless tobacco. She reports that she drinks about 4.8 oz of alcohol per week . She reports that she does not use drugs.  Family History  Problem Relation Age of Onset  . Diabetes Mother   . Hypertension Mother   . Depression Mother   . Anxiety disorder Mother     Review of Systems: Noncontributory  PHYSICAL EXAM: Blood pressure 134/86, pulse 61, temperature 97.8 F (36.6 C), temperature source Oral, resp. rate 18, height 5' 5.5" (1.664 m), weight 183 lb (83 kg), last menstrual period 11/13/2016, SpO2 100 %. CONSTITUTIONAL: Well-developed, well-nourished female in no acute distress.  HENT:  Normocephalic, atraumatic, External right and left ear normal. Oropharynx is clear and moist EYES: Conjunctivae and EOM are normal. Pupils are equal, round, and  reactive to light. No scleral icterus.  NECK: Normal range of motion, supple, no masses SKIN: Skin is warm and dry. No rash noted. Not diaphoretic. No erythema. No pallor. NEUROLOGIC: Alert and oriented to person, place, and time. Normal reflexes, muscle tone coordination. No cranial nerve deficit noted. PSYCHIATRIC: Normal mood and affect. Normal behavior. Normal judgment and thought content. CARDIOVASCULAR: Normal heart rate noted, regular rhythm RESPIRATORY: Effort and breath sounds normal, no problems with respiration noted ABDOMEN: Soft, nontender, nondistended. PELVIC: Deferred MUSCULOSKELETAL: Normal range of motion. No edema and no tenderness. 2+ distal pulses.  Labs: Results for orders placed or performed during the hospital encounter of 11/15/16 (from the past 336 hour(s))  Pregnancy, urine   Collection Time: 11/15/16  9:30 AM  Result Value Ref Range   Preg Test, Ur NEGATIVE NEGATIVE  Results for orders placed or performed during the hospital encounter of 11/03/16 (from the past 336 hour(s))  CBC   Collection Time: 11/03/16  3:35 PM  Result Value Ref Range   WBC 7.6 4.0 - 10.5 K/uL   RBC 4.26 3.87 - 5.11 MIL/uL   Hemoglobin 13.2 12.0 - 15.0 g/dL   HCT 19.137.8 47.836.0 - 29.546.0 %   MCV 88.7 78.0 - 100.0 fL   MCH 31.0 26.0 - 34.0 pg   MCHC 34.9 30.0 - 36.0 g/dL   RDW 62.113.5 30.811.5 - 65.715.5 %   Platelets 279 150 - 400 K/uL  Type and screen   Collection Time: 11/03/16  3:35 PM  Result Value Ref Range   ABO/RH(D) O POS    Antibody Screen NEG    Sample Expiration 11/17/2016    Extend sample reason NO TRANSFUSIONS OR PREGNANCY IN THE PAST 3 MONTHS   ABO/Rh   Collection Time: 11/03/16  3:35 PM  Result Value Ref Range   ABO/RH(D) O POS   Results for orders placed or performed in visit on 11/03/16 (from the past 336 hour(s))  Cytology - PAP   Collection Time: 11/03/16 12:00 AM  Result Value Ref Range   Adequacy      Satisfactory for evaluation  endocervical/transformation zone  component PRESENT.   Diagnosis      NEGATIVE FOR INTRAEPITHELIAL LESIONS OR MALIGNANCY.   HPV NOT DETECTED    Material Submitted CervicoVaginal Pap [ThinPrep Imaged]   Pregnancy, urine POC   Collection Time: 11/03/16  3:36 PM  Result Value Ref Range   Preg Test, Ur NEGATIVE NEGATIVE   11/03/2016  Endometrial Biopsy: BENIGN ENDOCERVIX AND RARE INACTIVE ENDOMETRIAL-TYPE GLANDS.  Imaging Studies: 10/06/2015  TRANSABDOMINAL AND TRANSVAGINAL ULTRASOUND OF PELVIS CLINICAL DATA:  Patient with pelvic pain for 1 year.  Prior endometrial ablation. COMPARISON:  Pelvic ultrasound 07/23/2014.  FINDINGS: Uterus  Measurements: 7.2 x 4.4 x 5.2 cm. No fibroids or other mass visualized. Endometrium  Poorly visualized likely secondary to prior ablation. Right ovary  Measurements: 1.9 x 1.3 x 1.3 cm. Normal appearance/no adnexal mass. Left ovary  The left ovary is not visualized. There is a 1.1 x 1.0 x 1.0 cm simple cyst within the left adnexa which is nonspecific however may represent a paraovarian or paratubal cyst. Other findings No abnormal free fluid. IMPRESSION: No acute process within the pelvis.  Assessment: . Status post HTA endometrial ablation 09/18/14  . Pelvic pain in female  . Abnormal uterine bleeding (AUB) refractory to AUB   Plan: Patient will undergo surgical management with laparoscopic-assisted vaginal hysterectomy (LAVH) and prophylactic bilateral salpingectomy.   The risks of surgery were discussed in detail with the patient including but not limited to: bleeding which may require transfusion or reoperation; infection which may require antibiotics; injury to surrounding organs which may involve bowel, bladder, ureters ; need for additional procedures including laparotomy; thromboembolic phenomenon, surgical site problems and other postoperative/anesthesia complications. Likelihood of success in alleviating the patient's condition was discussed. Patient was also advised that she will  remain in house for 1 night; and expected recovery time after a hysterectomy is 6-8 weeks. Other routine postoperative instructions will be reviewed with the patient and her family in detail after surgery.  The patient concurred with the proposed plan, giving informed written consent for the surgery.  Patient has been NPO since last night and she will remain NPO for procedure.  Anesthesia and OR aware.  Preoperative prophylactic antibiotics and SCDs ordered on call to the OR.  To OR when ready.   Jaynie Collins, MD, FACOG Attending Obstetrician & Gynecologist, [facility] for [employer], [facility]

## 2016-11-15 NOTE — Anesthesia Procedure Notes (Signed)
Procedure Name: Intubation Date/Time: 11/15/2016 10:52 AM Performed by: Jonna Munro Pre-anesthesia Checklist: Patient identified, Emergency Drugs available, Suction available, Patient being monitored and Timeout performed Patient Re-evaluated:Patient Re-evaluated prior to inductionOxygen Delivery Method: Circle system utilized Preoxygenation: Pre-oxygenation with 100% oxygen Intubation Type: IV induction Ventilation: Mask ventilation without difficulty Laryngoscope Size: Mac and 3 Grade View: Grade I Tube type: Oral Tube size: 7.0 mm Number of attempts: 1 Airway Equipment and Method: Stylet Placement Confirmation: ETT inserted through vocal cords under direct vision,  positive ETCO2 and breath sounds checked- equal and bilateral Secured at: 22 cm Tube secured with: Tape Dental Injury: Teeth and Oropharynx as per pre-operative assessment

## 2016-11-15 NOTE — Op Note (Signed)
Christinea Camper PROCEDURE DATE: 11/15/2016   PREOPERATIVE DIAGNOSES: Chronic pelvic pain, abnormal uterine bleeding refractory to endometrial ablation POSTOPERATIVE DIAGNOSES: The same PROCEDURE: Laparoscopic assisted vaginal hysterectomy, bilateral salpingectomy SURGEON:  Dr. Jaynie CollinsUgonna Yaret Hush ASSISTANT: Dr. Scheryl DarterJames Arnold  INDICATIONS: 46 y.o. [id number] with aforementioned preoperative diagnoses here today for definitive surgical management.   Risks of surgery were discussed with the patient including but not limited to: bleeding which may require transfusion or reoperation; infection which may require antibiotics; injury to bowel, bladder, ureters or other surrounding organs; need for additional procedures including laparotomy; thromboembolic phenomenon, incisional problems and other postoperative/anesthesia complications. Written informed consent was obtained.    FINDINGS:  Small uterus, normal ovaries bilaterally. Bilateral hydrosalpinges status post previous ligation.  No evidence of endometriosis, adhesions or any other abdominal/pelvic abnormality.  Normal upper abdomen.  ANESTHESIA:    General INTRAVENOUS FLUIDS: 1700 ml ESTIMATED BLOOD LOSS: 200 ml SPECIMENS: Uterus, cervix, bilateral fallopian tubes. COMPLICATIONS: None immediate  PROCEDURE IN DETAIL:  The patient received intravenous antibiotics and had sequential compression devices applied to her lower extremities while in the preoperative area.  She was then taken to the operating room where general anesthesia was administered and was found to be adequate.  She was placed in the dorsal lithotomy position, and was prepped and draped in a sterile manner.  A Foley catheter was inserted into her bladder and attached to constant drainage and a uterine manipulator was then advanced into the uterus .  After an adequate timeout was performed, attention was turned to the abdomen where an umbilical incision was made with the scalpel.  The Optiview  5-mm trocar and sleeve were then advanced without difficulty with the laparoscope under direct visualization into the abdomen.  The abdomen was then insufflated with carbon dioxide gas and adequate pneumoperitoneum was obtained. Bilateral 5-mm lower quadrant ports were then placed under direct visualization.  A survey of the patient's pelvis and abdomen revealed the findings as above. The fallopian tubes were freed from the underlying mesosalpinx with the Harmonic device, and they were left attached to the uterus. The round ligaments were then clamped and transected.  Excellent hemostasis was noted, and the decision was made to leave the trocars in place and proceed with completing the hysterectomy via the vaginal route .  Attention was then turned to her pelvis.  A weighted speculum was then placed in the vagina, and the anterior and posterior lips of the cervix were grasped bilaterally with tenaculums.  The cervix was then injected circumferentially with 0.5% Marcaine with epinephrine solution.  The cervix was then circumferentially incised, and the posterior cul-de-sac was entered sharply without difficulty and a retractor was placed.  A long weighted speculum was inserted into the posterior cul-de-sac. The anterior cul-de-sac was entered sharply without difficulty.    The Heaney clamp was then used to clamp the uterosacral ligaments on either side.  They were then cut and sutured ligated with 0 Vicryl, and were held with a tag for later identification. Of note, all sutures used in this case were 0 Vicryl unless otherwise noted.  The cardinal ligaments were then clamped, cut and ligated bilaterally. The uterine vessels and broad ligaments were then serially clamped with the Heaney clamps, cut, and suture ligated on both sides. Excellent hemostasis was noted at this point. The uterus was noted to be freed from all ligaments and was then delivered and sent to pathology.   After completion of the hysterectomy, all  pedicles from the uterosacral ligament to  the cornua were examined hemostasis was confirmed.  The vaginal cuff was then closed in a running locked fashion with 0 Vicryl with Adkison given to incorporate the uterosacral pedicles bilaterally.  All instruments were then removed from the pelvis.  Attention was then returned to her abdomen which was insufflated again with carbon dioxide gas.  The laparoscope was used to survey the operative site, and it was found to be hemostatic.   No intraoperative injury to other surrounding organs was noted.  The abdomen was desufflated and all instruments were then removed from the patient's abdomen. All skin incisions were closed with Dermabond. The patient tolerated the procedures well.  All instruments, needles, and sponge counts were correct x 3. The patient was taken to the recovery room awake, extubated and in stable condition.     Jaynie Collins, MD, FACOG Attending Obstetrician & Gynecologist Faculty Practice, [facility]

## 2016-11-15 NOTE — Anesthesia Postprocedure Evaluation (Signed)
Anesthesia Post Note  [name]  Procedure(s) Performed: Procedure(s) (LRB): LAPAROSCOPIC ASSISTED VAGINAL HYSTERECTOMY WITH SALPINGECTOMY (Bilateral)  Patient location during evaluation: PACU Anesthesia Type: General Level of consciousness: sedated and patient cooperative Pain management: pain level controlled Vital Signs Assessment: post-procedure vital signs reviewed and stable Respiratory status: spontaneous breathing Cardiovascular status: stable Anesthetic complications: no        Last Vitals:  Vitals:   11/15/16 1415 11/15/16 1433  BP:  130/68  Pulse:  84  Resp:    Temp: 36.8 C 37.1 C    Last Pain:  Vitals:   11/15/16 1433  TempSrc:   PainSc: 5    Pain Goal: Patients Stated Pain Goal: 3 (11/15/16 1433)               [name]

## 2016-11-16 ENCOUNTER — Encounter (HOSPITAL_COMMUNITY): Payer: Self-pay | Admitting: Obstetrics & Gynecology

## 2016-11-16 DIAGNOSIS — G8929 Other chronic pain: Secondary | ICD-10-CM | POA: Diagnosis not present

## 2016-11-16 LAB — CBC
HCT: 32.4 % — ABNORMAL LOW (ref 36.0–46.0)
HEMOGLOBIN: 11.3 g/dL — AB (ref 12.0–15.0)
MCH: 31.3 pg (ref 26.0–34.0)
MCHC: 34.9 g/dL (ref 30.0–36.0)
MCV: 89.8 fL (ref 78.0–100.0)
Platelets: 228 10*3/uL (ref 150–400)
RBC: 3.61 MIL/uL — AB (ref 3.87–5.11)
RDW: 13.3 % (ref 11.5–15.5)
WBC: 11.4 10*3/uL — AB (ref 4.0–10.5)

## 2016-11-16 MED ORDER — OXYCODONE-ACETAMINOPHEN 5-325 MG PO TABS
1.0000 | ORAL_TABLET | Freq: Four times a day (QID) | ORAL | 0 refills | Status: AC | PRN
Start: 2016-11-16 — End: ?

## 2016-11-16 MED ORDER — IBUPROFEN 800 MG PO TABS: 800.0000 mg | ORAL_TABLET | Freq: Three times a day (TID) | ORAL | 3 refills | Status: AC | PRN

## 2016-11-16 MED ORDER — DOCUSATE SODIUM 100 MG PO CAPS: 100.0000 mg | ORAL_CAPSULE | Freq: Two times a day (BID) | ORAL | 2 refills | Status: AC | PRN

## 2016-11-16 NOTE — Progress Notes (Signed)
Discharge instructions completed, questions answered, states understanding. Signed and given copy.

## 2016-11-16 NOTE — Addendum Note (Signed)
Addendum  created 11/16/16 1012 by Graciela HusbandsWynn O Cortlyn Cannell, CRNA   Sign clinical note

## 2016-11-16 NOTE — Discharge Summary (Signed)
Gynecology Physician Postoperative Discharge Summary  Patient ID: Ruth Santana MRN: [id number] DOB/AGE: 48/14/1972 48 y.o.  Admit Date: 11/15/2016 Discharge Date: 11/16/2016  Preoperative Diagnoses: Chronic pelvic pain, abnormal uterine bleeding refractory to endometrial ablation  Procedures: LAPAROSCOPIC ASSISTED VAGINAL HYSTERECTOMY WITH BILATERAL SALPINGECTOMY  [facility]  [facility] 46 y.[facility]. [id number]  admitted for scheduled surgery.  She underwent the procedures as mentioned above, her operation was uncomplicated. For further details about surgery, please refer to the operative report. Patient had an uncomplicated postoperative course. By time of discharge on POD#1, her pain was controlled on oral pain medications; she was ambulating, voiding without difficulty, tolerating regular diet and passing flatus. She was deemed stable for discharge to home.   Significant Labs: CBC Latest Ref Rng & Units 11/16/2016 11/03/2016 09/18/2014  WBC 4.0 - 10.5 K/uL 11.4(H) 7.6 6.5  Hemoglobin 12.0 - 15.0 g/dL 11.3(L) 13.2 12.4  Hematocrit 36.0 - 46.0 % 32.4(L) 37.8 38.2  Platelets 150 - 400 K/uL 228 279 293    Discharge Exam: Blood pressure 121/72, pulse 75, temperature 98.6 F (37 C), temperature source Oral, resp. rate 18, height 5' 5.5" (1.664 m), weight 183 lb (83 kg), last menstrual period 11/13/2016, SpO2 95 %. General appearance: alert and no distress  Resp: clear to auscultation bilaterally  Cardio: regular rate and rhythm  GI: soft, non-tender; bowel sounds normal; no masses, no organomegaly.  Incision: C/D/I, no erythema, no drainage noted Pelvic: scant blood on pad  Extremities: extremities normal, atraumatic, no cyanosis or edema and Homans sign is negative, no sign of DVT  Discharged Condition: Stable  Disposition: 01-Home or Self Jo[place]   Allergies as of 11/16/2016   No Known Allergies     Medication List    STOP taking these medications   megestrol 40 MG  tablet Commonly known as:  MEGACE   traMADol 50 MG tablet Commonly known as:  ULTRAM     TAKE these medications   buPROPion 150 MG 12 hr tablet Commonly known as:  WELLBUTRIN SR TAKE ONE TABLET BY MOUTH TWICE DAILY   CLARITIN 10 MG tablet Generic drug:  loratadine Take 10 mg by mouth daily.   docusate sodium 100 MG capsule Commonly known as:  COLACE Take 1 capsule (100 mg total) by mouth 2 (two) times daily as needed.   ibuprofen 800 MG tablet Commonly known as:  ADVIL,MOTRIN Take 1 tablet (800 mg total) by mouth 3 (three) times daily with meals as needed for headache or moderate pain. What changed:  when to take this   Melatonin 5 MG Tabs Take 5 mg by mouth at bedtime.   oxyCODONE-acetaminophen 5-325 MG tablet Commonly known as:  PERCOCET/ROXICET Take 1-2 tablets by mouth every 6 (six) hours as needed. What changed:  Another medication with the same name was added. Make sure you understand how and when to take each.   oxyCODONE-acetaminophen 5-325 MG tablet Commonly known as:  PERCOCET/ROXICET Take 1-2 tablets by mouth every 6 (six) hours as needed for severe pain (moderate to severe pain (when tolerating fluids)). What changed:  You were already taking a medication with the same name, and this prescription was added. Make sure you understand how and when to take each.      Follow-up Information    Jaynie CollinsUgonna Zenna Traister, MD Follow up on 12/19/2016.   Specialty:  Obstetrics and Gynecology Why:  10 am for postoperative appointment Contact information: [address] [place] [id number] [phone]  Signed:  Verita Schneiders, MD, [place] Attending [place], [employer]

## 2016-11-16 NOTE — Discharge Instructions (Signed)
Laparoscopically Assisted Vaginal Hysterectomy, [place] After °Refer to this sheet in the next few weeks. These instructions provide you with information on caring for yourself after your procedure. Your health Connett provider may also give you more specific instructions. Your treatment has been planned according to current medical practices, but problems sometimes occur. Call your health Soh provider if you have any problems or questions after your procedure. °What can I expect after the procedure? °After your procedure, it is typical to have the following: °· Abdominal pain. You will be given pain medicine to control it. °· Sore throat from the breathing tube that was inserted during surgery. ° °Follow these instructions at home: °· Only take over-the-counter or prescription medicines for pain, discomfort, or fever as directed by your health Niemeier provider. °· Do not take aspirin. It can cause bleeding. °· Do not drive when taking pain medicine. °· Follow your health Deangelo provider's advice regarding diet, exercise, lifting, driving, and general activities. °· Resume your usual diet as directed and allowed. °· Get plenty of rest and sleep. °· Do not douche, use tampons, or have sexual intercourse for at least 6 weeks, or until your health Curl provider gives you permission. °· Change your bandages (dressings) as directed by your health Gong[place] provider. °· Monitor your temperature and notify your health Alred provider of a fever. °· Take showers instead of baths for 2-3 weeks. °· Do not drink alcohol until your health Trzcinski provider gives you permission. °· If you develop constipation, you may take a mild laxative with your health Szczerba provider's permission. Bran foods may help with constipation problems. Drinking enough fluids to keep your urine clear or pale yellow may help as well. °· Try to have someone home with you for 1-2 weeks to help around the house. °· Keep all of your follow-up appointments as directed by your  health Quinto provider. °Contact a health [place] provider if: °· You have swelling, redness, or increasing pain around your incision sites. °· You have pus coming from your incision. °· You notice a bad smell coming from your incision. °· Your incision breaks open. °· You feel dizzy or lightheaded. °· You have pain or bleeding when you urinate. °· You have persistent diarrhea. °· You have persistent nausea and vomiting. °· You have abnormal vaginal discharge. °· You have a rash. °· You have any type of abnormal reaction or develop an allergy to your medicine. °· You have poor pain control with your prescribed medicine. °Get help right away if: °· You have a fever. °· You have severe abdominal pain. °· You have chest pain. °· You have shortness of breath. °· You faint. °· You have pain, swelling, or redness in your leg. °· You have heavy vaginal bleeding with blood clots. °This information is not intended to replace advice given to you by your health Starkel provider. Make sure you discuss any questions you have with your health Izard provider. °Document Released: 08/11/2011 Document Revised: 01/28/2016 Document Reviewed: 03/07/2013 °Elsevier Interactive Patient Education © 2017 [employer] Inc. ° °

## 2016-11-16 NOTE — Anesthesia Postprocedure Evaluation (Signed)
Anesthesia Post Note  [facility]  Procedure(s) Performed: Procedure(s) (LRB): LAPAROSCOPIC ASSISTED VAGINAL HYSTERECTOMY WITH SALPINGECTOMY (Bilateral)  Patient location during evaluation: [facility] Anesthesia Type: General Level of consciousness: awake and alert Pain management: satisfactory to patient Vital Signs Assessment: post-procedure vital signs reviewed and stable Respiratory status: spontaneous breathing and respiratory function stable Cardiovascular status: stable Postop Assessment: adequate PO intake Anesthetic complications: no        Last Vitals:  Vitals:   11/16/16 0328 11/16/16 0800  BP: 122/72 121/72  Pulse: 63 75  Resp: 18   Temp: 36.9 C 37 C    Last Pain:  Vitals:   11/16/16 0915  TempSrc:   PainSc: 8    Pain Goal: Patients Stated Pain Goal: 3 (11/16/16 0509)               Karleen DolphinFUSSELL,Seher Schlagel

## 2016-11-21 ENCOUNTER — Telehealth: Payer: Self-pay | Admitting: *Deleted

## 2016-11-21 NOTE — Telephone Encounter (Signed)
Judeth CornfieldStephanie left a message this am stating she is returning Dr. Roberts GaudyA's call from where Dr. Mervyn SkeetersA was calling to see how she was doing after her surgery Tuesday 11/15/16. States she is doing fine as far as the surgery but she has developed a new symptom- thinks it is vertigo. States she can't move without getting dizzy. States is like the worst hangover. Asks for someone to call her about this.

## 2016-11-21 NOTE — Telephone Encounter (Signed)
I called and discussed with Judeth CornfieldStephanie and she also reports symptoms come and go, sometimes dizziness, headache , neck pain. I discussed with Dr. Macon LargeAnyanwu and she does not supspect that it is at all related to her surgery- She did consult with anesthesia and they also did not think it was related to her surgery or anesthesia.  I called Judeth CornfieldStephanie and we discussed this and that if it persists we recommend that she go a pcp or urgent Nee.  We discussed we do not advise her to be up alone when dizzy and to not take percocet unless pain severe and she has someone there with her because we do not want her to fall. She states she is only taking the ibuprofen. We also discussed drinking fluids to prevent dehydration. She voices understanding.

## 2016-12-05 ENCOUNTER — Telehealth: Payer: Self-pay | Admitting: *Deleted

## 2016-12-05 NOTE — Telephone Encounter (Signed)
Pt left message stating that she had surgery on 3/13. (LAVH and bilateral salpingectomy) She is going on vacation on 4/4 and wants to know if she can get in a hot tub. I consulted with Dr. Debroah Loop who stated that pt may not get in a hot tub. I called pt and advised that she may not get in the hot tub because her body has not fully healed from the surgery and this may cause harm.  She may do light walking and provided she is not having any vaginal bleeding may get in a pool but not to swim laps. Pt voiced understanding of information given.

## 2016-12-19 ENCOUNTER — Ambulatory Visit (INDEPENDENT_AMBULATORY_CARE_PROVIDER_SITE_OTHER): Payer: BLUE CROSS/BLUE SHIELD | Admitting: Obstetrics & Gynecology

## 2016-12-19 ENCOUNTER — Encounter: Payer: Self-pay | Admitting: Obstetrics & Gynecology

## 2016-12-19 VITALS — BP 135/79 | HR 65 | Wt 192.3 lb

## 2016-12-19 DIAGNOSIS — Z90710 Acquired absence of both cervix and uterus: Secondary | ICD-10-CM

## 2016-12-19 DIAGNOSIS — Z09 Encounter for follow-up examination after completed treatment for conditions other than malignant neoplasm: Secondary | ICD-10-CM

## 2016-12-19 NOTE — Progress Notes (Signed)
Subjective:   Ruth Santana is a 46 y.o. [phone] female who presents to the clinic 4 weeks status post laparoscopic assisted vaginal hysterectomy and bilateral salpingectomy for abnormal uterine bleeding and pelvic pain. Eating a regular diet without difficulty. Bowel movements are normal. The patient is not having any pain.  The following portions of the patient's history were reviewed and updated as appropriate: allergies, current medications, past family history, past medical history, past social history, past surgical history and problem list.  Review of Systems Pertinent items noted in HPI and remainder of comprehensive ROS otherwise negative.    Objective:    BP 135/79   Pulse 65   Wt 192 lb 4.8 oz (87.2 kg)   LMP 11/13/2016 (Exact Date)   BMI 31.51 kg/m  General:  alert and no distress  Abdomen: soft, bowel sounds active, non-tender  Incision:   healing well, no drainage, no erythema, no hernia, no seroma, no swelling, no dehiscence, incision well approximated  Pelvic: Well-healing vaginal cuff   Surgical Pathology Uterus and bilateral fallopian tubes, cervix - PROLIFERATIVE PATTERN ENDOMETRIUM WITH UNDERLYING UNREMARKABLE MYOMETRIUM - BENIGN CERVIX. - BENIGN RIGHT FALLOPIAN TUBE WITH CYSTIC DILATATION (HYDROSALPINX). - BENIGN LEFT FALLOPIAN TUBE - NO ATYPIA, DYSPLASIA, HYPERPLASIA OR MALIGNANCY IDENTIFIED.  Assessment:    Doing well postoperatively. Operative findings again reviewed. Pathology report discussed.    Plan:   1. Continue any current medications. 2. Wound Nickell discussed. 3. Activity restrictions: none 4. Anticipated return to work: now. 5. Follow up as needed.   Jaynie Collins, MD, FACOG Attending Obstetrician & Gynecologist, [facility] for [employer], [facility]

## 2016-12-19 NOTE — Patient Instructions (Signed)
Return to clinic for any scheduled appointments or for any gynecologic concerns as needed.   

## 2017-05-29 ENCOUNTER — Telehealth: Payer: Self-pay | Admitting: General Practice

## 2017-05-29 NOTE — Telephone Encounter (Signed)
Called and notified patient of appointment with Dr. Macon Large on 06/21/17 at 8:40am.  Patient voiced understanding.

## 2017-05-29 NOTE — Telephone Encounter (Signed)
Patient called and left message stating she is a patient of Dr Mont Dutton and had a hysterectomy in March. Patient states lately she has been having severe cramping, headaches, & pain with intercourse. She wants to know if this is normal or if she should come in. Called patient and discussed she should come in for an appt to see Dr Macon Large as those wouldn't be expected symptoms. Patient states she thought maybe she was having an IBS flare but her stomach seems fine. Patient reports using lubricant & trying different positions but is still having pain with intercourse. Told patient I will let the front office staff know she needs an appt and they will contact her to set something up. Patient verbalized understanding & had no questions

## 2017-06-21 ENCOUNTER — Ambulatory Visit: Payer: Self-pay | Admitting: Obstetrics & Gynecology

## 2017-06-21 NOTE — Progress Notes (Signed)
Per provider pt can call and reschedule as she chooses.

## 2017-06-28 IMAGING — US US PELVIS COMPLETE
1 series · 15 of 25 positions shown · non-contrast
Comparison: Pelvic ultrasound 07/23/2014.

CLINICAL DATA: Patient with pelvic pain for 1 year. Prior
endometrial ablation.

EXAM:
TRANSABDOMINAL AND TRANSVAGINAL ULTRASOUND OF PELVIS
TECHNIQUE: Both transabdominal and transvaginal ultrasound examinations of the
pelvis were performed. Transabdominal technique was performed for
global imaging of the pelvis including uterus, ovaries, adnexal
regions, and pelvic cul-de-sac. It was necessary to proceed with
endovaginal exam following the transabdominal exam to visualize the
endometrium and adnexal structures.

[Series 1: us pelvis complete · 47 acquisitions, 15 frames shown]
[im 1/47]
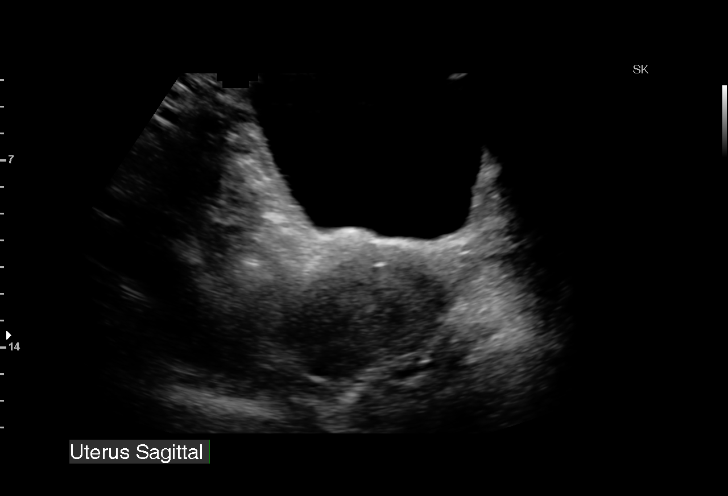
[im 4/47]
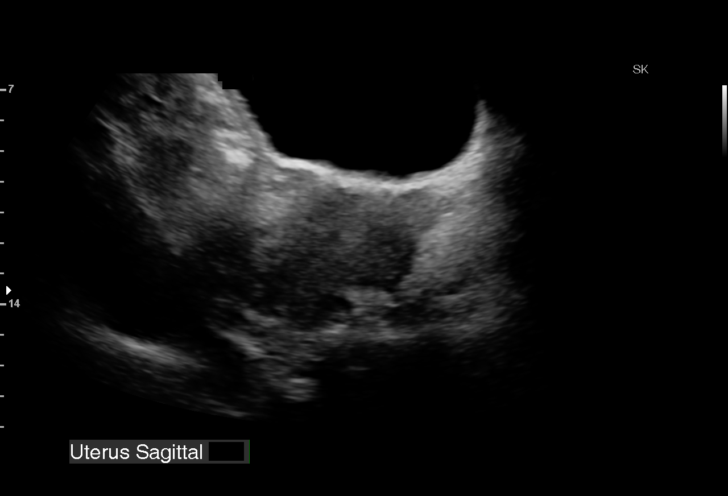
[im 8/47]
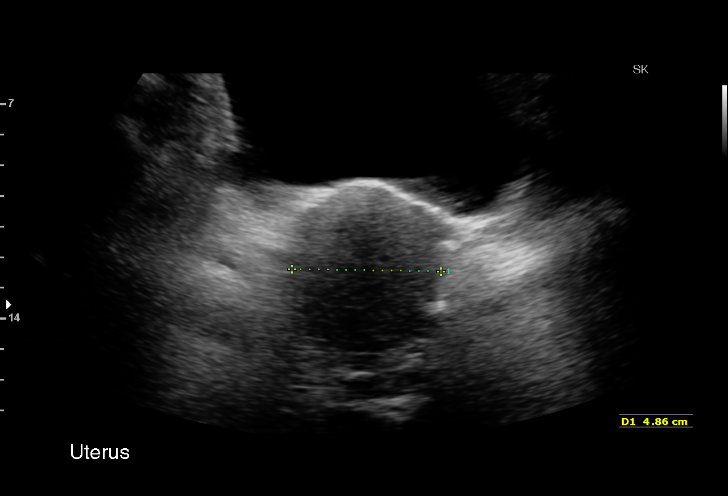
[im 10/47]
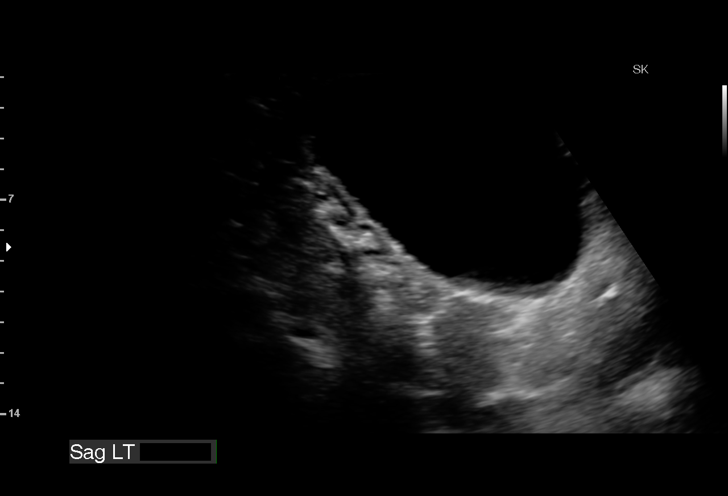
[im 14/47]
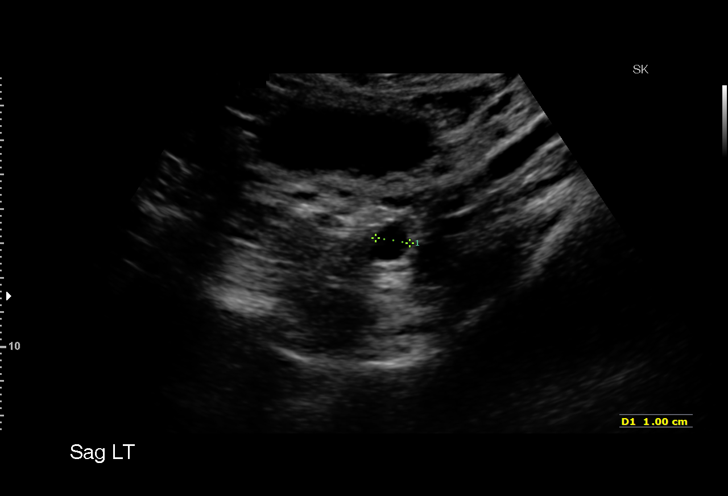
[im 18/47]
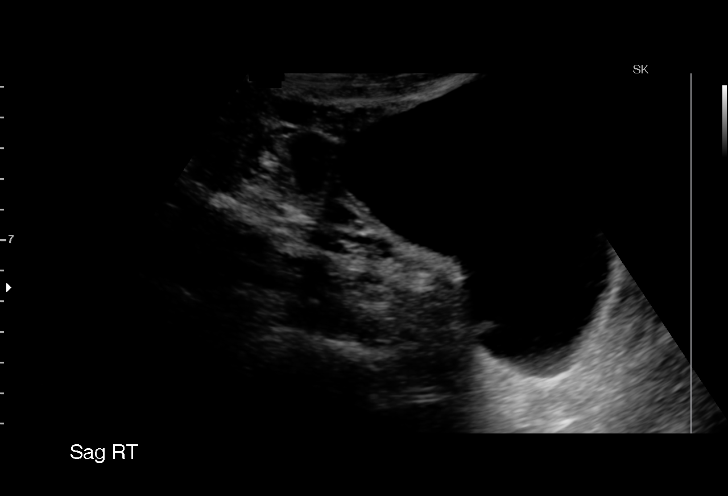
[im 20/47]
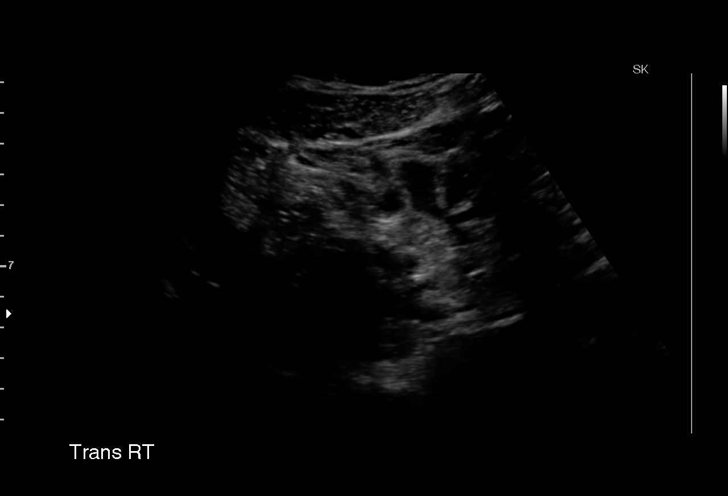
[im 24/47]
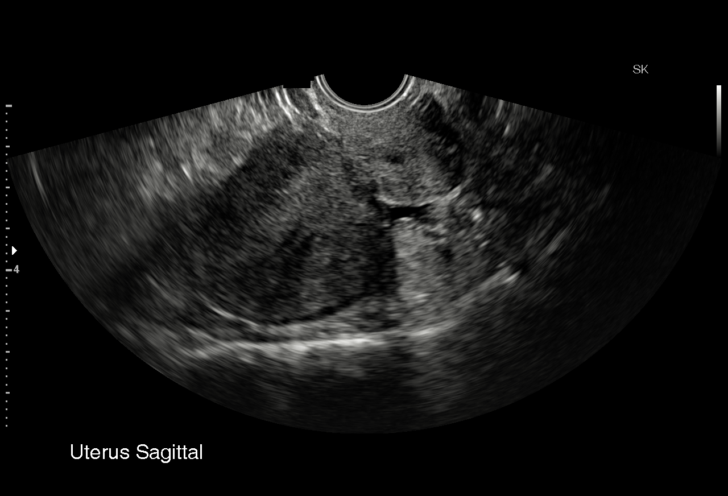
[im 27/47]
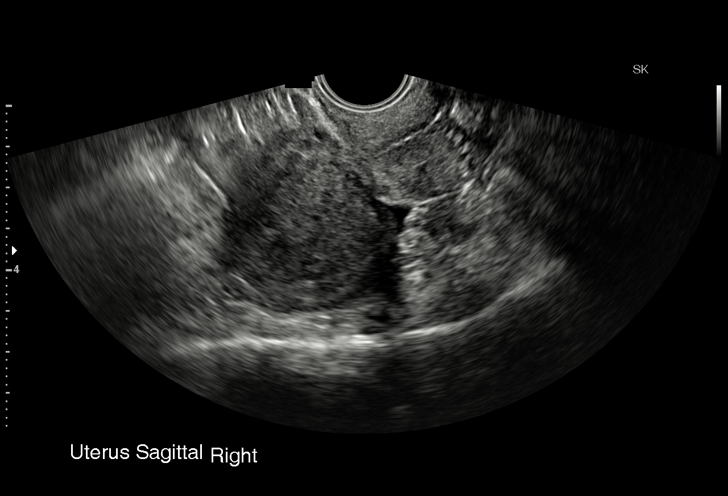
[im 29/47]
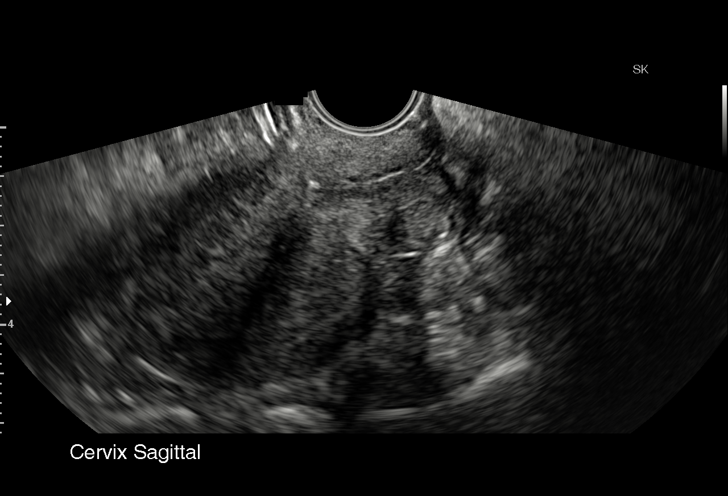
[im 33/47]
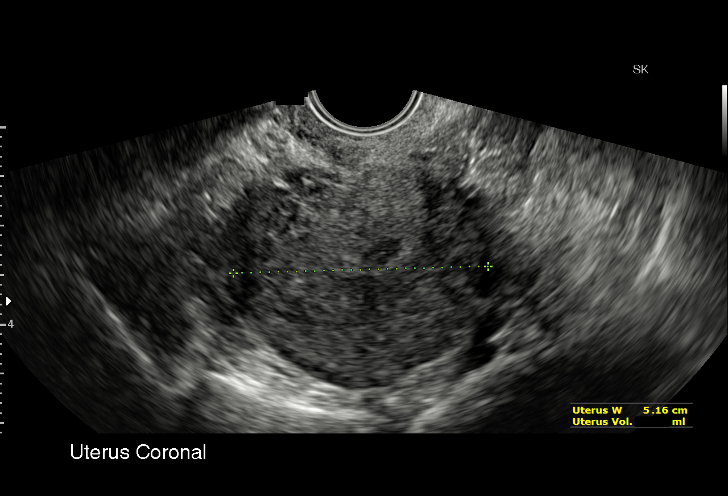
[im 37/47]
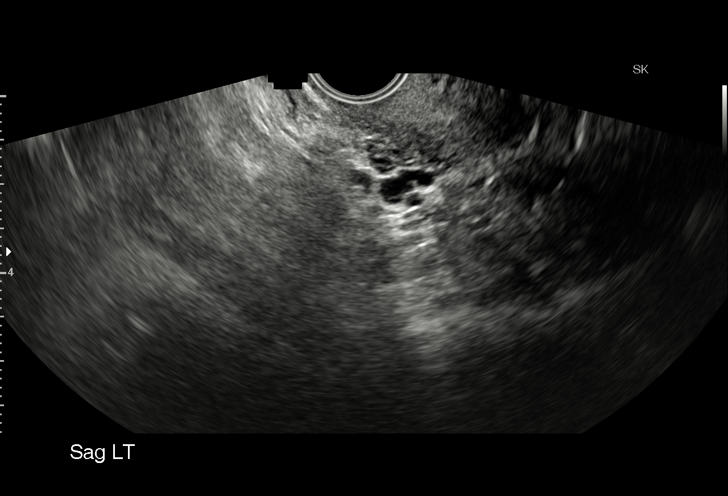
[im 39/47]
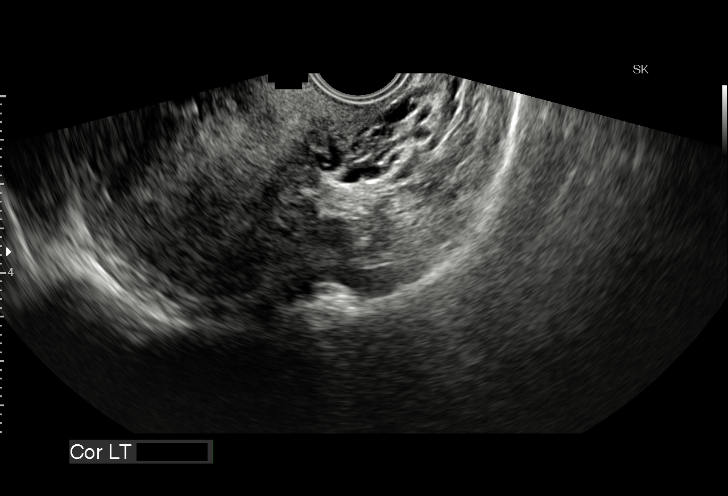
[im 43/47]
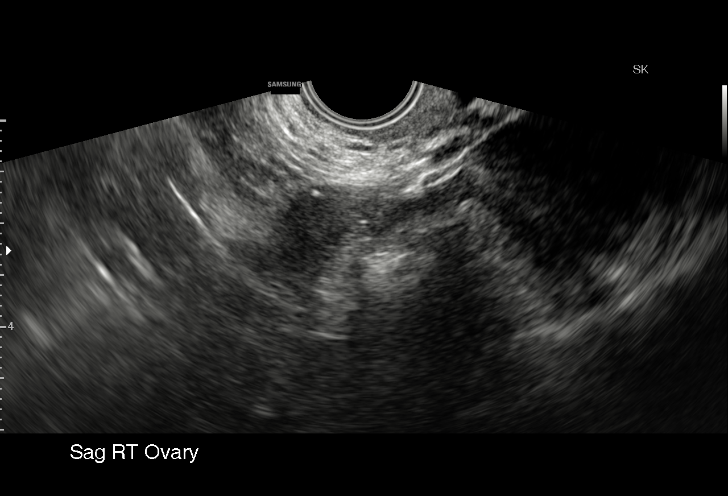
[im 47/47]
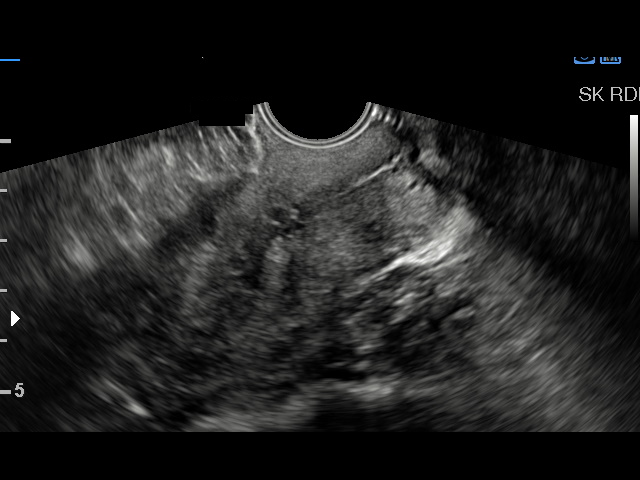

[15 of 25 positions shown; findings below may reference images not displayed]

FINDINGS: Uterus

Measurements: 7.2 x 4.4 x 5.2 cm. No fibroids or other mass
visualized.

Endometrium

Poorly visualized likely secondary to prior ablation.

Right ovary

Measurements: 1.9 x 1.3 x 1.3 cm. Normal appearance/no adnexal mass.

Left ovary

The left ovary is not visualized. There is a 1.1 x 1.0 x 1.0 cm
simple cyst within the left adnexa which is nonspecific however may
represent a paraovarian or paratubal cyst.

Other findings

No abnormal free fluid.
IMPRESSION: No acute process within the pelvis.

The left ovary is not visualized.

## 2018-07-02 ENCOUNTER — Encounter: Payer: Self-pay | Admitting: *Deleted
